# Patient Record
Sex: Male | Born: 1995 | Race: Black or African American | Hispanic: No | Smoking: Never smoker
Health system: Southern US, Community
[De-identification: ages and names within clinical notes are randomized; demographics above are authoritative.]

## PROBLEM LIST (undated history)

## (undated) HISTORY — PX: TONSILLECTOMY: SUR1361

---
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
## 2015-05-29 ENCOUNTER — Emergency Department (HOSPITAL_COMMUNITY)
Admission: EM | Admit: 2015-05-29 | Discharge: 2015-05-29 | Disposition: A | Discharge status: Home / Self Care | Payer: Medicaid Other | Attending: Emergency Medicine | Admitting: Emergency Medicine

## 2015-05-29 ENCOUNTER — Encounter (HOSPITAL_COMMUNITY): Payer: Self-pay | Admitting: *Deleted

## 2015-05-29 DIAGNOSIS — M25512 Pain in left shoulder: Secondary | ICD-10-CM | POA: Insufficient documentation

## 2015-05-29 DIAGNOSIS — Z720 Tobacco use: Secondary | ICD-10-CM | POA: Insufficient documentation

## 2015-05-29 DIAGNOSIS — Z86718 Personal history of other venous thrombosis and embolism: Secondary | ICD-10-CM | POA: Insufficient documentation

## 2015-05-29 DIAGNOSIS — Z86711 Personal history of pulmonary embolism: Secondary | ICD-10-CM | POA: Insufficient documentation

## 2015-05-29 MED ORDER — IBUPROFEN 400 MG PO TABS
600.0000 mg | ORAL_TABLET | Freq: Once | ORAL | Status: AC
  Administered 2015-05-29: 600 mg via ORAL
  Filled 2015-05-29 (×2): qty 1

## 2015-05-29 NOTE — Discharge Instructions (Signed)
Cryotherapy °Cryotherapy is when you put ice on your injury. Ice helps lessen pain and puffiness (swelling) after an injury. Ice works the best when you start using it in the first 24 to 48 hours after an injury. °HOME CARE °· Put a dry or damp towel between the ice pack and your skin. °· You may press gently on the ice pack. °· Leave the ice on for no more than 10 to 20 minutes at a time. °· Check your skin after 5 minutes to make sure your skin is okay. °· Rest at least 20 minutes between ice pack uses. °· Stop using ice when your skin loses feeling (numbness). °· Do not use ice on someone who cannot tell you when it hurts. This includes small children and people with memory problems (dementia). °GET HELP RIGHT AWAY IF: °· You have white spots on your skin. °· Your skin turns blue or pale. °· Your skin feels waxy or hard. °· Your puffiness gets worse. °MAKE SURE YOU:  °· Understand these instructions. °· Will watch your condition. °· Will get help right away if you are not doing well or get worse. °  °This information is not intended to replace advice given to you by your health care provider. Make sure you discuss any questions you have with your health care provider. °  °Document Released: 01/17/2008 Document Revised: 10/23/2011 Document Reviewed: 03/23/2011 °Elsevier Interactive Patient Education ©2016 Elsevier Inc. ° °Joint Pain °Joint pain, which is also called arthralgia, can be caused by many things. Joint pain often goes away when you follow your health care provider's instructions for relieving pain at home. However, joint pain can also be caused by conditions that require further treatment. Common causes of joint pain include: °· Bruising in the area of the joint. °· Overuse of the joint. °· Wear and tear on the joints that occur with aging (osteoarthritis). °· Various other forms of arthritis. °· A buildup of a crystal form of uric acid in the joint (gout). °· Infections of the joint (septic arthritis)  or of the bone (osteomyelitis). °Your health care provider may recommend medicine to help with the pain. If your joint pain continues, additional tests may be needed to diagnose your condition. °HOME CARE INSTRUCTIONS °Watch your condition for any changes. Follow these instructions as directed to lessen the pain that you are feeling. °· Take medicines only as directed by your health care provider. °· Rest the affected area for as long as your health care provider says that you should. If directed to do so, raise the painful joint above the level of your heart while you are sitting or lying down. °· Do not do things that cause or worsen pain. °· If directed, apply ice to the painful area: °¨ Put ice in a plastic bag. °¨ Place a towel between your skin and the bag. °¨ Leave the ice on for 20 minutes, 2-3 times per day. °· Wear an elastic bandage, splint, or sling as directed by your health care provider. Loosen the elastic bandage or splint if your fingers or toes become numb and tingle, or if they turn cold and blue. °· Begin exercising or stretching the affected area as directed by your health care provider. Ask your health care provider what types of exercise are safe for you. °· Keep all follow-up visits as directed by your health care provider. This is important. °SEEK MEDICAL CARE IF: °· Your pain increases, and medicine does not help. °· Your joint   pain does not improve within 3 days.  You have increased bruising or swelling.  You have a fever.  You lose 10 lb (4.5 kg) or more without trying. SEEK IMMEDIATE MEDICAL CARE IF:  You are not able to move the joint.  Your fingers or toes become numb or they turn cold and blue.   This information is not intended to replace advice given to you by your health care provider. Make sure you discuss any questions you have with your health care provider.   Document Released: 07/31/2005 Document Revised: 08/21/2014 Document Reviewed: 05/12/2014 Elsevier  Interactive Patient Education 2016 [employer]  Please read attached information. If you experience any new or worsening signs or symptoms please return to the emergency room for evaluation. Please follow-up with your primary care provider or specialist as discussed. Please use medication prescribed only as directed and discontinue taking if you have any concerning signs or symptoms.

## 2015-05-29 NOTE — ED Notes (Signed)
Pt. States left shoulder just started hurting last night at 11 pm and hasnt stopped since. Denies injury or trauma.

## 2015-05-29 NOTE — ED Provider Notes (Signed)
CSN: [id number]     Arrival date & time 05/29/15  0618 History   First MD Initiated Contact with Patient 05/29/15 [phone]     Chief Complaint  Patient presents with  . Shoulder Pain   HPI   19 year old male presents today with left shoulder pain. Patient reports approximately 11 PM last night he started developing left lateral and anterior shoulder pain. He describes it as throbbing, denies any radiation of symptoms. He reports the pain is worse with movement of the extremity. Patient denies fever, chills, weight loss, nausea, vomiting, chest pain, shortness of breath, cough, abdominal pain, lower extremity swelling or edema, history DVT PE, or any other concerning signs or symptoms today. Patient reports that he's tried icy hot, no other over-the-counter therapies at this time. Patient denies any trauma to the shoulder, or history of the same. Patient denies any loss of distal sensation strength or function of the extremity, reports full active range of motion.   History reviewed. No pertinent past medical history. Past Surgical History  Procedure Laterality Date  . Tonsillectomy     History reviewed. No pertinent family history. Social History  Substance Use Topics  . Smoking status: Current Every Day Smoker -- 1.00 packs/day    Types: Cigarettes  . Smokeless tobacco: Never Used  . Alcohol Use: No    Review of Systems  All other systems reviewed and are negative.    Allergies  Review of patient's allergies indicates no known allergies.  Home Medications   Prior to Admission medications   Not on File   BP 122/80 mmHg  Pulse 93  Temp(Src) 98.8 F (37.1 C) (Oral)  Resp 18  SpO2 97% Physical Exam  Constitutional: He is oriented to person, place, and time. He appears well-developed and well-nourished.  HENT:  Head: Normocephalic and atraumatic.  Eyes: Conjunctivae are normal. Pupils are equal, round, and reactive to light. Right eye exhibits no discharge. Left eye exhibits  no discharge. No scleral icterus.  Neck: Normal range of motion. No JVD present. No tracheal deviation present.  Pulmonary/Chest: Effort normal. No stridor.  Musculoskeletal:  Tenderness to palpation of left lateral shoulder, no signs of deformity, rash, swelling. Pain with active flexion, internal/external rotation. Full active range of motion. Distal sensation, strength, motor function intact, cap refill less than 3 seconds, radial pulses 2+ and equal bilateral  Neurological: He is alert and oriented to person, place, and time. Coordination normal.  Psychiatric: He has a normal mood and affect. His behavior is normal. Judgment and thought content normal.  Nursing note and vitals reviewed.   ED Course  Procedures (including critical care time) Labs Review Labs Reviewed - No data to display  Imaging Review No results found. I have personally reviewed and evaluated these images and lab results as part of my medical decision-making.   EKG Interpretation None      MDM   Final diagnoses:  Left shoulder pain    Labs:  Imaging:  Consults:  Therapeutics:  Discharge Meds:   Assessment/Plan: Patient presents with left shoulder pain, most consistent with musculoskeletal pain. He has reproducible pain with range of motion exercises. This is unlikely to be referred pain due to GI, pulmonary, cardiac, or vascular. Patient will be instructed to use ibuprofen or Tylenol as needed for pain, follow up with primary care for reevaluation 1 week if symptoms persist. He states instructed return immediately if new or worsening signs or symptoms present.         Tinnie Gens  Arriyana Rodell, PA-C 05/31/15 1447  Tomasita CrumbleAdeleke Oni, MD 06/02/15 [phone]

## 2015-09-22 ENCOUNTER — Encounter (HOSPITAL_COMMUNITY): Payer: Self-pay | Admitting: Family Medicine

## 2015-09-22 ENCOUNTER — Emergency Department (HOSPITAL_COMMUNITY)
Admission: EM | Admit: 2015-09-22 | Discharge: 2015-09-22 | Disposition: A | Discharge status: Home / Self Care | Payer: Medicaid Other | Attending: Emergency Medicine | Admitting: Emergency Medicine

## 2015-09-22 DIAGNOSIS — Z711 Person with feared health complaint in whom no diagnosis is made: Secondary | ICD-10-CM

## 2015-09-22 DIAGNOSIS — F17210 Nicotine dependence, cigarettes, uncomplicated: Secondary | ICD-10-CM | POA: Insufficient documentation

## 2015-09-22 DIAGNOSIS — Z113 Encounter for screening for infections with a predominantly sexual mode of transmission: Secondary | ICD-10-CM | POA: Insufficient documentation

## 2015-09-22 NOTE — Discharge Instructions (Signed)
Sexually Transmitted Disease °A sexually transmitted disease (STD) is a disease or infection that may be passed (transmitted) from person to person, usually during sexual activity. This may happen by way of saliva, semen, blood, vaginal mucus, or urine. Common STDs include: °· Gonorrhea. °· Chlamydia. °· Syphilis. °· HIV and AIDS. °· Genital herpes. °· Hepatitis B and C. °· Trichomonas. °· Human papillomavirus (HPV). °· Pubic lice. °· Scabies. °· Mites. °· Bacterial vaginosis. °WHAT ARE CAUSES OF STDs? °An STD may be caused by bacteria, a virus, or parasites. STDs are often transmitted during sexual activity if one person is infected. However, they may also be transmitted through nonsexual means. STDs may be transmitted after:  °· Sexual intercourse with an infected person. °· Sharing sex toys with an infected person. °· Sharing needles with an infected person or using unclean piercing or tattoo needles. °· Having intimate contact with the genitals, mouth, or rectal areas of an infected person. °· Exposure to infected fluids during birth. °WHAT ARE THE SIGNS AND SYMPTOMS OF STDs? °Different STDs have different symptoms. Some people may not have any symptoms. If symptoms are present, they may include: °· Painful or bloody urination. °· Pain in the pelvis, abdomen, vagina, anus, throat, or eyes. °· A skin rash, itching, or irritation. °· Growths, ulcerations, blisters, or sores in the genital and anal areas. °· Abnormal vaginal discharge with or without bad odor. °· Penile discharge in men. °· Fever. °· Pain or bleeding during sexual intercourse. °· Swollen glands in the groin area. °· Yellow skin and eyes (jaundice). This is seen with hepatitis. °· Swollen testicles. °· Infertility. °· Sores and blisters in the mouth. °HOW ARE STDs DIAGNOSED? °To make a diagnosis, your health care provider may: °· Take a medical history. °· Perform a physical exam. °· Take a sample of any discharge to examine. °· Swab the throat,  cervix, opening to the penis, rectum, or vagina for testing. °· Test a sample of your first morning urine. °· Perform blood tests. °· Perform a Pap test, if this applies. °· Perform a colposcopy. °· Perform a laparoscopy. °HOW ARE STDs TREATED? °Treatment depends on the STD. Some STDs may be treated but not cured. °· Chlamydia, gonorrhea, trichomonas, and syphilis can be cured with antibiotic medicine. °· Genital herpes, hepatitis, and HIV can be treated, but not cured, with prescribed medicines. The medicines lessen symptoms. °· Genital warts from HPV can be treated with medicine or by freezing, burning (electrocautery), or surgery. Warts may come back. °· HPV cannot be cured with medicine or surgery. However, abnormal areas may be removed from the cervix, vagina, or vulva. °· If your diagnosis is confirmed, your recent sexual partners need treatment. This is true even if they are symptom-free or have a negative culture or evaluation. They should not have sex until their health care providers say it is okay. °· Your health care provider may test you for infection again 3 months after treatment. °HOW CAN I REDUCE MY RISK OF GETTING AN STD? °Take these steps to reduce your risk of getting an STD: °· Use latex condoms, dental dams, and water-soluble lubricants during sexual activity. Do not use petroleum jelly or oils. °· Avoid having multiple sex partners. °· Do not have sex with someone who has other sex partners °· Do not have sex with anyone you do not know or who is at high risk for an STD. °· Avoid risky sex practices that can break your skin. °· Do not have sex   if you have open sores on your mouth or skin. °· Avoid drinking too much alcohol or taking illegal drugs. Alcohol and drugs can affect your judgment and put you in a vulnerable position. °· Avoid engaging in oral and anal sex acts. °· Get vaccinated for HPV and hepatitis. If you have not received these vaccines in the past, talk to your health care  provider about whether one or both might be right for you. °· If you are at risk of being infected with HIV, it is recommended that you take a prescription medicine daily to prevent HIV infection. This is called pre-exposure prophylaxis (PrEP). You are considered at risk if: °¨ You are a man who has sex with other men (MSM). °¨ You are a heterosexual man or woman and are sexually active with more than one partner. °¨ You take drugs by injection. °¨ You are sexually active with a partner who has HIV. °· Talk with your health care provider about whether you are at high risk of being infected with HIV. If you choose to begin PrEP, you should first be tested for HIV. You should then be tested every 3 months for as long as you are taking PrEP. °WHAT SHOULD I DO IF I THINK I HAVE AN STD? °· See your health care provider. °· Tell your sexual partner(s). They should be tested and treated for any STDs. °· Do not have sex until your health care provider says it is okay. °WHEN SHOULD I GET IMMEDIATE MEDICAL CARE? °Contact your health care provider right away if:  °· You have severe abdominal pain. °· You are a man and notice swelling or pain in your testicles. °· You are a woman and notice swelling or pain in your vagina. °  °This information is not intended to replace advice given to you by your health care provider. Make sure you discuss any questions you have with your health care provider. °  °Document Released: 10/21/2002 Document Revised: 08/21/2014 Document Reviewed: 02/18/2013 °Elsevier Interactive Patient Education ©2016 Elsevier Inc. ° °

## 2015-09-22 NOTE — ED Provider Notes (Signed)
CSN: [id number]     Arrival date & time 09/22/15  1258 History  By signing my name below, I, Soijett Blue, attest that this documentation has been prepared under the direction and in the presence of Roxy Horseman, PA-C Electronically Signed: Soijett Blue, ED Scribe. 09/22/2015. 2:14 PM.   Chief Complaint  Patient presents with  . SEXUALLY TRANSMITTED DISEASE      The history is provided by the patient. No language interpreter was used.    Ricardo Randall is a 20 y.o. male who presents to the Emergency Department complaining of bumps to his penis since childhood. He notes that his girlfriend want him to be checked out for the bumps to his penis that he would like checked out. He notes that the bumps to his penis have been there since he was a little kid and he denies pain to the area. He states that he has not tried any medications for the relief for his symptoms. He denies penile discharge, dysuria, penile pain/swelling, testicular pain/swelling, and any other symptoms. Denies allergies to any medications.    History reviewed. No pertinent past medical history. Past Surgical History  Procedure Laterality Date  . Tonsillectomy     History reviewed. No pertinent family history. Social History  Substance Use Topics  . Smoking status: Current Every Day Smoker -- 1.00 packs/day    Types: Cigarettes  . Smokeless tobacco: Never Used  . Alcohol Use: No    Review of Systems  Constitutional: Negative for fever.  Genitourinary: Negative for dysuria, discharge, penile swelling, scrotal swelling, penile pain and testicular pain.       Bumps to penis      Allergies  Review of patient's allergies indicates no known allergies.  Home Medications   Prior to Admission medications   Not on File   BP 131/84 mmHg  Pulse 70  Temp(Src) 98 F (36.7 C) (Oral)  Resp 20  Wt 285 lb 1 oz (129.304 kg)  SpO2 98% Physical Exam  Constitutional: He is oriented to person, place, and time. He appears  well-developed and well-nourished. No distress.  HENT:  Head: Normocephalic and atraumatic.  Eyes: EOM are normal.  Neck: Neck supple.  Cardiovascular: Normal rate.   Pulmonary/Chest: Effort normal. No respiratory distress.  Genitourinary: Uncircumcised. No penile tenderness. No discharge found.  Scribe chaperone present for exam. No TTP. No lesions. No discharge. 3 small skin tags doesn't appear to be consisted with genital warts or herpes.   Musculoskeletal: Normal range of motion.  Neurological: He is alert and oriented to person, place, and time.  Skin: Skin is warm and dry.  Psychiatric: He has a normal mood and affect. His behavior is normal.  Nursing note and vitals reviewed.    ED Course  Procedures (including critical care time) DIAGNOSTIC STUDIES: Oxygen Saturation is 98% on RA, nl by my interpretation.    COORDINATION OF CARE: 2:13 PM Discussed treatment plan with pt at bedside which includes GC/chlamydia probe and pt agreed to plan.     MDM   Final diagnoses:  Concern about STD in male without diagnosis    Patient with skin tags on tip of penis. States that they have been there since he was a kid.  Does not look like herpes or genital warts.  Request STD check.  I personally performed the services described in this documentation, which was scribed in my presence. The recorded information has been reviewed and is accurate.     Roxy Horseman, PA-C  09/22/15 1430  Tilden Fossa, MD 09/23/15 [phone]

## 2015-09-22 NOTE — ED Notes (Signed)
Pt here for bumps to penis and wants to be checked for STD.

## 2015-09-23 LAB — GC/CHLAMYDIA PROBE AMP (~~LOC~~) NOT AT ARMC
CHLAMYDIA, DNA PROBE: NEGATIVE
NEISSERIA GONORRHEA: NEGATIVE

## 2016-04-20 ENCOUNTER — Emergency Department (HOSPITAL_COMMUNITY)
Admission: EM | Admit: 2016-04-20 | Discharge: 2016-04-21 | Disposition: A | Discharge status: Home / Self Care | Payer: Medicaid Other | Attending: Emergency Medicine | Admitting: Emergency Medicine

## 2016-04-20 ENCOUNTER — Encounter (HOSPITAL_COMMUNITY): Payer: Self-pay | Admitting: Emergency Medicine

## 2016-04-20 DIAGNOSIS — F17210 Nicotine dependence, cigarettes, uncomplicated: Secondary | ICD-10-CM | POA: Insufficient documentation

## 2016-04-20 DIAGNOSIS — K047 Periapical abscess without sinus: Secondary | ICD-10-CM | POA: Insufficient documentation

## 2016-04-20 NOTE — ED Triage Notes (Signed)
Pt here with right upper dental pain and swelling. Pt denies fever.

## 2016-04-21 MED ORDER — CLINDAMYCIN HCL 150 MG PO CAPS
450.0000 mg | ORAL_CAPSULE | Freq: Once | ORAL | Status: AC
  Administered 2016-04-21: 450 mg via ORAL
  Filled 2016-04-21: qty 3

## 2016-04-21 MED ORDER — BUPIVACAINE-EPINEPHRINE (PF) 0.5% -1:200000 IJ SOLN
1.8000 mL | Freq: Once | INTRAMUSCULAR | Status: AC
  Administered 2016-04-21: 1.8 mL
  Filled 2016-04-21: qty 1.8

## 2016-04-21 MED ORDER — CLINDAMYCIN HCL 150 MG PO CAPS: 450.0000 mg | ORAL_CAPSULE | Freq: Three times a day (TID) | ORAL | 0 refills | Status: DC

## 2016-04-21 MED ORDER — IBUPROFEN 800 MG PO TABS
800.0000 mg | ORAL_TABLET | Freq: Once | ORAL | Status: AC
  Administered 2016-04-21: 800 mg via ORAL
  Filled 2016-04-21: qty 1

## 2016-04-21 MED ORDER — HYDROCODONE-ACETAMINOPHEN 5-325 MG PO TABS: 1.0000 | ORAL_TABLET | Freq: Three times a day (TID) | ORAL | 0 refills | Status: DC | PRN

## 2016-04-21 MED ORDER — BUPIVACAINE HCL 0.25 % IJ SOLN
5.0000 mL | Freq: Once | INTRAMUSCULAR | Status: DC
  Filled 2016-04-21: qty 5

## 2016-04-21 NOTE — ED Provider Notes (Signed)
[facility] DEPT Provider Note   CSN: [id number] Arrival date & time: 04/20/16  2223     History   Chief Complaint Chief Complaint  Patient presents with  . Dental Pain    HPI Orie RoutDarian R Randall is a 20 y.o. male with no major medical problems presents to the Emergency Department complaining of gradual, persistent, progressively worsening dental pain and gingival swelling onset 48 hours prior. Associated symptoms include pain at the site. Pain is worse with eating and drinking. Treatments prior to arrival. Patient denies fever, chills, nausea, vomiting. Unknown last visit to the dentist.    The history is provided by the patient and medical records. No language interpreter was used.    History reviewed. No pertinent past medical history.  There are no active problems to display for this patient.   Past Surgical History:  Procedure Laterality Date  . TONSILLECTOMY         Home Medications    Prior to Admission medications   Medication Sig Start Date End Date Taking? Authorizing Provider  clindamycin (CLEOCIN) 150 MG capsule Take 3 capsules (450 mg total) by mouth 3 (three) times daily. 04/21/16   Jazae Gandolfi, PA-C  HYDROcodone-acetaminophen (NORCO/VICODIN) 5-325 MG tablet Take 1 tablet by mouth every 8 (eight) hours as needed for severe pain. 04/21/16   Dahlia ClientHannah Kairos Panetta, PA-C    Family History History reviewed. No pertinent family history.  Social History Social History  Substance Use Topics  . Smoking status: Current Every Day Smoker    Packs/day: 1.00    Types: Cigarettes  . Smokeless tobacco: Never Used  . Alcohol use No     Allergies   Review of patient's allergies indicates no known allergies.   Review of Systems Review of Systems  Constitutional: Negative for chills, fatigue and fever.  HENT: Positive for dental problem.   Gastrointestinal: Negative for abdominal pain, nausea and vomiting.  All other systems reviewed and are  negative.    Physical Exam Updated Vital Signs BP 142/85   Pulse 81   Temp 98.5 F (36.9 C) (Oral)   Resp 18   Ht 5\' 9"  (1.753 m)   Wt 129.3 kg   SpO2 99%   BMI 42.09 kg/m   Physical Exam  Constitutional: He appears well-developed and well-nourished.  HENT:  Head: Normocephalic.  Right Ear: Tympanic membrane, external ear and ear canal normal.  Left Ear: Tympanic membrane, external ear and ear canal normal.  Nose: Nose normal. Right sinus exhibits no maxillary sinus tenderness and no frontal sinus tenderness. Left sinus exhibits no maxillary sinus tenderness and no frontal sinus tenderness.  Mouth/Throat: Uvula is midline, oropharynx is clear and moist and mucous membranes are normal. No oral lesions. Abnormal dentition. Dental abscesses and dental caries present. No uvula swelling or lacerations. No oropharyngeal exudate, posterior oropharyngeal edema, posterior oropharyngeal erythema or tonsillar abscesses.  Tooth # 2-3 with gingival erythema and abscess to the lateral gingiva No fluctuance or induration to the buccal mucosa or floor of the mouth  Eyes: Conjunctivae are normal. Pupils are equal, round, and reactive to light. Right eye exhibits no discharge. Left eye exhibits no discharge.  Neck: Normal range of motion. Neck supple.  No stridor Handling secretions without difficulty No nuchal rigidity No cervical lymphadenopathy   Cardiovascular: Normal rate, regular rhythm and normal heart sounds.   Pulmonary/Chest: Effort normal. No respiratory distress.  Equal chest rise  Abdominal: Soft. Bowel sounds are normal. He exhibits no distension. There is no tenderness.  Lymphadenopathy:    He has no cervical adenopathy.  Neurological: He is alert.  Skin: Skin is warm and dry.  Psychiatric: He has a normal mood and affect.  Nursing note and vitals reviewed.    ED Treatments / Results  Labs (all labs ordered are listed, but only abnormal results are displayed) Labs  Reviewed - No data to display  EKG  EKG Interpretation None       Radiology No results found.  Procedures .Marland KitchenIncision and Drainage Date/Time: 04/21/2016 12:39 AM Performed by: Dierdre Forth Authorized by: Dierdre Forth   Consent:    Consent obtained:  Verbal   Consent given by:  Patient   Risks discussed:  Bleeding, incomplete drainage, infection and pain   Alternatives discussed:  No treatment and alternative treatment (antibiotics alone) Location:    Type:  Abscess   Location:  Mouth   Mouth location: gingival. Anesthesia (see MAR for exact dosages):    Anesthesia method:  Local infiltration   Local anesthetic:  Bupivacaine 0.5% WITH epi Procedure type:    Complexity:  Simple Procedure details:    Needle aspiration: no     Incision types:  Single straight   Incision depth:  Dermal   Scalpel blade:  11   Wound management:  Probed and deloculated   Drainage:  Bloody and purulent   Drainage amount:  Moderate   Wound treatment:  Wound left open   Packing materials:  None Post-procedure details:    Patient tolerance of procedure:  Tolerated well, no immediate complications   (including critical care time)  Medications Ordered in ED Medications  bupivacaine-epinephrine (MARCAINE W/ EPI) 0.5% -1:200000 injection 1.8 mL (not administered)  ibuprofen (ADVIL,MOTRIN) tablet 800 mg (not administered)  clindamycin (CLEOCIN) capsule 450 mg (not administered)     Initial Impression / Assessment and Plan / ED Course  I have reviewed the triage vital signs and the nursing notes.  Pertinent labs & imaging results that were available during my care of the patient were reviewed by me and considered in my medical decision making (see chart for details).  Clinical Course    Patient with toothache. Abscess noted. I and D performed without complication..  Exam unconcerning for Ludwig's angina or spread of infection.  Will treat with clindamycin and pain medicine.   Urged patient to follow-up with dentist.     Final Clinical Impressions(s) / ED Diagnoses   Final diagnoses:  Dental abscess    New Prescriptions New Prescriptions   CLINDAMYCIN (CLEOCIN) 150 MG CAPSULE    Take 3 capsules (450 mg total) by mouth 3 (three) times daily.   HYDROCODONE-ACETAMINOPHEN (NORCO/VICODIN) 5-325 MG TABLET    Take 1 tablet by mouth every 8 (eight) hours as needed for severe pain.     Dahlia Client Sahej Hauswirth, PA-C 04/21/16 1610    Rolland Porter, MD 04/30/16 [phone]

## 2016-04-21 NOTE — Discharge Instructions (Signed)
1. Medications: vicodin if regular usage of ibuprofen does not provide adequate relief, clindamycin usual home medications 2. Treatment: rest, drink plenty of fluids, take medications as prescribed 3. Follow Up: Please followup with dentistry within 1 week for discussion of your diagnoses and further evaluation after today's visit; if you do not have a primary care doctor use the resource guide provided to find one; Return to the ER for high fevers, difficulty breathing, difficulty swallowing or other concerning symptoms

## 2016-07-10 ENCOUNTER — Encounter (HOSPITAL_COMMUNITY): Payer: Self-pay | Admitting: Emergency Medicine

## 2016-07-10 ENCOUNTER — Emergency Department (HOSPITAL_COMMUNITY)
Admission: EM | Admit: 2016-07-10 | Discharge: 2016-07-10 | Disposition: A | Discharge status: Home / Self Care | Payer: Self-pay | Attending: Emergency Medicine | Admitting: Emergency Medicine

## 2016-07-10 ENCOUNTER — Emergency Department (HOSPITAL_COMMUNITY): Payer: Self-pay

## 2016-07-10 DIAGNOSIS — Y929 Unspecified place or not applicable: Secondary | ICD-10-CM | POA: Insufficient documentation

## 2016-07-10 DIAGNOSIS — S91002A Unspecified open wound, left ankle, initial encounter: Secondary | ICD-10-CM | POA: Insufficient documentation

## 2016-07-10 DIAGNOSIS — Y999 Unspecified external cause status: Secondary | ICD-10-CM | POA: Insufficient documentation

## 2016-07-10 DIAGNOSIS — F17200 Nicotine dependence, unspecified, uncomplicated: Secondary | ICD-10-CM | POA: Insufficient documentation

## 2016-07-10 DIAGNOSIS — Y939 Activity, unspecified: Secondary | ICD-10-CM | POA: Insufficient documentation

## 2016-07-10 DIAGNOSIS — W3400XA Accidental discharge from unspecified firearms or gun, initial encounter: Secondary | ICD-10-CM | POA: Insufficient documentation

## 2016-07-10 DIAGNOSIS — S81832A Puncture wound without foreign body, left lower leg, initial encounter: Secondary | ICD-10-CM

## 2016-07-10 DIAGNOSIS — Z5181 Encounter for therapeutic drug level monitoring: Secondary | ICD-10-CM | POA: Insufficient documentation

## 2016-07-10 LAB — CBC WITH DIFFERENTIAL/PLATELET
BASOS ABS: 0 10*3/uL (ref 0.0–0.1)
BASOS PCT: 0 %
EOS ABS: 0.1 10*3/uL (ref 0.0–0.7)
EOS PCT: 1 %
HCT: 43.5 % (ref 39.0–52.0)
Hemoglobin: 15.5 g/dL (ref 13.0–17.0)
LYMPHS PCT: 38 %
Lymphs Abs: 4.4 10*3/uL — ABNORMAL HIGH (ref 0.7–4.0)
MCH: 31.3 pg (ref 26.0–34.0)
MCHC: 35.6 g/dL (ref 30.0–36.0)
MCV: 87.9 fL (ref 78.0–100.0)
MONO ABS: 1 10*3/uL (ref 0.1–1.0)
Monocytes Relative: 9 %
Neutro Abs: 5.9 10*3/uL (ref 1.7–7.7)
Neutrophils Relative %: 52 %
PLATELETS: 225 10*3/uL (ref 150–400)
RBC: 4.95 MIL/uL (ref 4.22–5.81)
RDW: 12.5 % (ref 11.5–15.5)
WBC: 11.5 10*3/uL — ABNORMAL HIGH (ref 4.0–10.5)

## 2016-07-10 LAB — APTT: APTT: 27 s (ref 24–36)

## 2016-07-10 LAB — BASIC METABOLIC PANEL
Anion gap: 9 (ref 5–15)
BUN: 6 mg/dL (ref 6–20)
CALCIUM: 9.2 mg/dL (ref 8.9–10.3)
CO2: 23 mmol/L (ref 22–32)
CREATININE: 0.96 mg/dL (ref 0.61–1.24)
Chloride: 107 mmol/L (ref 101–111)
GFR calc Af Amer: >60 mL/min (ref >60)
GLUCOSE: 113 mg/dL — AB (ref 65–99)
Potassium: 3.5 mmol/L (ref 3.5–5.1)
SODIUM: 139 mmol/L (ref 135–145)

## 2016-07-10 LAB — TYPE AND SCREEN
ABO/RH(D): O POS
ANTIBODY SCREEN: NEGATIVE

## 2016-07-10 LAB — PROTIME-INR
INR: 1.02
PROTHROMBIN TIME: 13.4 s (ref 11.4–15.2)

## 2016-07-10 LAB — ABO/RH: ABO/RH(D): O POS

## 2016-07-10 MED ORDER — SODIUM CHLORIDE 0.9 % IV BOLUS (SEPSIS)
1000.0000 mL | Freq: Once | INTRAVENOUS | Status: AC
  Administered 2016-07-10: 1000 mL via INTRAVENOUS

## 2016-07-10 MED ORDER — TETANUS-DIPHTH-ACELL PERTUSSIS 5-2.5-18.5 LF-MCG/0.5 IM SUSP
0.5000 mL | Freq: Once | INTRAMUSCULAR | Status: AC
  Administered 2016-07-10: 0.5 mL via INTRAMUSCULAR
  Filled 2016-07-10: qty 0.5

## 2016-07-10 MED ORDER — HYDROCODONE-ACETAMINOPHEN 5-325 MG PO TABS
1.0000 | ORAL_TABLET | ORAL | 0 refills | Status: DC | PRN
Start: 2016-07-10 — End: 2018-07-30

## 2016-07-10 MED ORDER — CEFAZOLIN IN D5W 1 GM/50ML IV SOLN
1.0000 g | Freq: Once | INTRAVENOUS | Status: AC
  Administered 2016-07-10: 1 g via INTRAVENOUS
  Filled 2016-07-10: qty 50

## 2016-07-10 MED ORDER — CEPHALEXIN 500 MG PO CAPS: 500.0000 mg | ORAL_CAPSULE | Freq: Three times a day (TID) | ORAL | 0 refills | Status: DC

## 2016-07-10 NOTE — ED Notes (Signed)
Ortho tech called and will place post op boot.

## 2016-07-10 NOTE — ED Notes (Signed)
Patient handed urinal to use.
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
## 2016-07-10 NOTE — Progress Notes (Signed)
Orthopedic Tech Progress Note Patient Details:  Ricardo Randall 06/23/1996 [id number]  Ortho Devices Type of Ortho Device: CAM walker, Crutches Ortho Device/Splint Location: Applied Cam Walker and Crutches with Training for Left lower Leg (LLE) Ortho Device/Splint Interventions: Application, Adjustment   Alvina ChouWilliams, Marlet Korte C 07/10/2016, 7:08 PM
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
## 2016-07-10 NOTE — Discharge Instructions (Signed)
Take antibiotics as prescribed. Take Norco as needed for pain. If your pain is not that severe you may take over the counter ibuprofen instead. Keep the area clean with warm water and soap. Return to the emergency room for new or worsening symptoms.

## 2016-07-11 ENCOUNTER — Encounter (HOSPITAL_COMMUNITY): Payer: Self-pay | Admitting: Emergency Medicine

## 2016-08-04 ENCOUNTER — Emergency Department (HOSPITAL_COMMUNITY)
Admission: EM | Admit: 2016-08-04 | Discharge: 2016-08-04 | Disposition: A | Discharge status: Home / Self Care | Attending: Emergency Medicine | Admitting: Emergency Medicine

## 2016-08-04 ENCOUNTER — Encounter (HOSPITAL_COMMUNITY): Payer: Self-pay

## 2016-08-04 DIAGNOSIS — L089 Local infection of the skin and subcutaneous tissue, unspecified: Secondary | ICD-10-CM

## 2016-08-04 DIAGNOSIS — L0889 Other specified local infections of the skin and subcutaneous tissue: Secondary | ICD-10-CM | POA: Insufficient documentation

## 2016-08-04 DIAGNOSIS — T148XXA Other injury of unspecified body region, initial encounter: Secondary | ICD-10-CM

## 2016-08-04 DIAGNOSIS — F17210 Nicotine dependence, cigarettes, uncomplicated: Secondary | ICD-10-CM | POA: Insufficient documentation

## 2016-08-04 LAB — CBC WITH DIFFERENTIAL/PLATELET
BASOS PCT: 0 %
Basophils Absolute: 0 10*3/uL (ref 0.0–0.1)
EOS ABS: 0.1 10*3/uL (ref 0.0–0.7)
EOS PCT: 1 %
HCT: 42.3 % (ref 39.0–52.0)
Hemoglobin: 15.2 g/dL (ref 13.0–17.0)
LYMPHS ABS: 1.8 10*3/uL (ref 0.7–4.0)
Lymphocytes Relative: 27 %
MCH: 31.2 pg (ref 26.0–34.0)
MCHC: 35.9 g/dL (ref 30.0–36.0)
MCV: 86.9 fL (ref 78.0–100.0)
MONOS PCT: 11 %
Monocytes Absolute: 0.8 10*3/uL (ref 0.1–1.0)
Neutro Abs: 4 10*3/uL (ref 1.7–7.7)
Neutrophils Relative %: 61 %
PLATELETS: 207 10*3/uL (ref 150–400)
RBC: 4.87 MIL/uL (ref 4.22–5.81)
RDW: 12.5 % (ref 11.5–15.5)
WBC: 6.7 10*3/uL (ref 4.0–10.5)

## 2016-08-04 LAB — I-STAT CHEM 8, ED
BUN: 5 mg/dL — ABNORMAL LOW (ref 6–20)
CALCIUM ION: 1.15 mmol/L (ref 1.15–1.40)
Chloride: 103 mmol/L (ref 101–111)
Creatinine, Ser: 1.3 mg/dL — ABNORMAL HIGH (ref 0.61–1.24)
Glucose, Bld: 96 mg/dL (ref 65–99)
HEMATOCRIT: 42 % (ref 39.0–52.0)
HEMOGLOBIN: 14.3 g/dL (ref 13.0–17.0)
Potassium: 3.9 mmol/L (ref 3.5–5.1)
SODIUM: 138 mmol/L (ref 135–145)
TCO2: 23 mmol/L (ref 0–100)
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
## 2016-08-04 NOTE — Discharge Instructions (Signed)
Go to Dr. Greig RightMurphy's office for recheck today. We spoke to Dr. Dion SaucierLandau on the phone who advised us to send you there.   Normal CBC today. WBC 6.7.  Creatinine 1.3, will need close recheck
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
## 2016-08-07 LAB — AEROBIC CULTURE W GRAM STAIN (SUPERFICIAL SPECIMEN)

## 2016-08-07 LAB — AEROBIC CULTURE  (SUPERFICIAL SPECIMEN)

## 2016-08-08 ENCOUNTER — Telehealth (HOSPITAL_BASED_OUTPATIENT_CLINIC_OR_DEPARTMENT_OTHER): Payer: Self-pay | Admitting: Emergency Medicine

## 2016-08-08 NOTE — Progress Notes (Signed)
ED Antimicrobial Stewardship Positive Culture Follow Up   Ricardo Randall is an 20 y.o. male who presented to [facility] on 08/04/2016 with a chief complaint of  Chief Complaint  Patient presents with  . Wound Infection    Recent Results (from the past 720 hour(s))  Wound or Superficial Culture     Status: None   Collection Time: 08/04/16  9:08 AM  Result Value Ref Range Status   Specimen Description WOUND ANKLE  Final   Special Requests NONE  Final   Gram Stain   Final    RARE WBC PRESENT, PREDOMINANTLY PMN ABUNDANT GRAM POSITIVE COCCI IN PAIRS IN CLUSTERS ABUNDANT GRAM NEGATIVE COCCOBACILLI    Culture   Final    MODERATE ENTEROBACTER SPECIES MODERATE PROTEUS MIRABILIS ABUNDANT EIKENELLA CORRODENS    Report Status 08/07/2016 FINAL  Final   Organism ID, Bacteria ENTEROBACTER SPECIES  Final   Organism ID, Bacteria PROTEUS MIRABILIS  Final      Susceptibility   Enterobacter species - MIC*    CEFAZOLIN >=64 RESISTANT Resistant     CEFEPIME <=1 SENSITIVE Sensitive     CEFTAZIDIME <=1 SENSITIVE Sensitive     CEFTRIAXONE <=1 SENSITIVE Sensitive     CIPROFLOXACIN <=0.25 SENSITIVE Sensitive     GENTAMICIN <=1 SENSITIVE Sensitive     IMIPENEM <=0.25 SENSITIVE Sensitive     TRIMETH/SULFA <=20 SENSITIVE Sensitive     PIP/TAZO <=4 SENSITIVE Sensitive     * MODERATE ENTEROBACTER SPECIES   Proteus mirabilis - MIC*    AMPICILLIN <=2 SENSITIVE Sensitive     CEFAZOLIN <=4 SENSITIVE Sensitive     CEFEPIME <=1 SENSITIVE Sensitive     CEFTAZIDIME <=1 SENSITIVE Sensitive     CEFTRIAXONE <=1 SENSITIVE Sensitive     CIPROFLOXACIN <=0.25 SENSITIVE Sensitive     GENTAMICIN <=1 SENSITIVE Sensitive     IMIPENEM 8 INTERMEDIATE Intermediate     TRIMETH/SULFA <=20 SENSITIVE Sensitive     AMPICILLIN/SULBACTAM <=2 SENSITIVE Sensitive     PIP/TAZO <=4 SENSITIVE Sensitive     * MODERATE PROTEUS MIRABILIS    Per notes he is being treated with Bactrim which covers these currently isolated  organisms.  [place] and will ask the flow manager to send this result to them for their information as well.  Sallee Provencalurner, Lanise Mergen S 08/08/2016, 9:34 AM Infectious Diseases Pharmacist Phone# [phone]

## 2016-08-08 NOTE — Telephone Encounter (Signed)
Post ED Visit - Positive Culture Follow-up  Culture report reviewed by antimicrobial stewardship pharmacist:  []  Enzo BiNathan Batchelder, Pharm.D. []  Celedonio MiyamotoJeremy Frens, Pharm.D., BCPS []  Garvin FilaMike Maccia, Pharm.D. []  Georgina PillionElizabeth Martin, Pharm.D., BCPS []  Stacey StreetMinh Pham, [address]Ph[address]D., BCPS, AAHIVP []  Estella HuskMichelle Turner, Pharm.D., BCPS, AAHIVP []  Tennis Mustassie Stewart, Pharm.D. []  Sherle Poeob Vincent, [address]arm[address]  Positive wound culture Treated with none, culture report faxed to Medical fax number @ [facility] @ [phone]  Berle MullMiller, Andelyn Spade 08/08/2016, 12:51 PM

## 2018-01-26 ENCOUNTER — Encounter (HOSPITAL_COMMUNITY): Payer: Self-pay | Admitting: *Deleted

## 2018-01-26 ENCOUNTER — Emergency Department (HOSPITAL_COMMUNITY)
Admission: EM | Admit: 2018-01-26 | Discharge: 2018-01-26 | Disposition: A | Discharge status: Home / Self Care | Attending: Emergency Medicine | Admitting: Emergency Medicine

## 2018-01-26 ENCOUNTER — Other Ambulatory Visit: Payer: Self-pay

## 2018-01-26 DIAGNOSIS — F17210 Nicotine dependence, cigarettes, uncomplicated: Secondary | ICD-10-CM | POA: Insufficient documentation

## 2018-01-26 DIAGNOSIS — K047 Periapical abscess without sinus: Secondary | ICD-10-CM | POA: Insufficient documentation

## 2018-01-26 DIAGNOSIS — Z79899 Other long term (current) drug therapy: Secondary | ICD-10-CM | POA: Insufficient documentation

## 2018-01-26 MED ORDER — PENICILLIN V POTASSIUM 250 MG PO TABS
500.0000 mg | ORAL_TABLET | Freq: Once | ORAL | Status: AC
  Administered 2018-01-26: 500 mg via ORAL
  Filled 2018-01-26: qty 2

## 2018-01-26 MED ORDER — IBUPROFEN 800 MG PO TABS
800.0000 mg | ORAL_TABLET | Freq: Once | ORAL | Status: AC
Start: 2018-01-26 — End: 2018-01-26
  Administered 2018-01-26: 800 mg via ORAL
  Filled 2018-01-26: qty 1

## 2018-01-26 MED ORDER — PENICILLIN V POTASSIUM 500 MG PO TABS: 500.0000 mg | ORAL_TABLET | Freq: Four times a day (QID) | ORAL | 0 refills | Status: AC

## 2018-01-26 MED ORDER — LIDOCAINE-EPINEPHRINE (PF) 2 %-1:200000 IJ SOLN
10.0000 mL | Freq: Once | INTRAMUSCULAR | Status: AC
  Administered 2018-01-26: 10 mL
  Filled 2018-01-26: qty 20

## 2018-01-26 MED ORDER — IBUPROFEN 600 MG PO TABS: 600.0000 mg | ORAL_TABLET | Freq: Four times a day (QID) | ORAL | 0 refills | Status: DC | PRN

## 2018-01-26 NOTE — ED Triage Notes (Signed)
Pt noticed abscess to L lower gum line since this morning. Hx of the same

## 2018-01-26 NOTE — ED Provider Notes (Signed)
[facility] EMERGENCY DEPARTMENT Provider Note   CSN: [id number] Arrival date & time: 01/26/18  0000     History   Chief Complaint Chief Complaint  Patient presents with  . Abscess    HPI Ricardo Randall is a 22 y.o. male.  HPI Ricardo Randall is a 22 y.o. male presents emergency department complaining of dental abscess.  Patient states he developed pain to the left lower tooth yesterday.  He states this morning woke up with, swelling.  History of dental swelling and abscesses in the past that had to be drained.  He is requesting his abscess to be drained.  Denies any fever or chills.  No facial swelling.  No difficulty breathing or swallowing.  Did not take any medications prior to coming in.  Does not have a dentist.  History reviewed. No pertinent past medical history.  There are no active problems to display for this patient.   Past Surgical History:  Procedure Laterality Date  . TONSILLECTOMY          Home Medications    Prior to Admission medications   Medication Sig Start Date End Date Taking? Authorizing Provider  cephALEXin (KEFLEX) 500 MG capsule Take 1 capsule (500 mg total) by mouth 3 (three) times daily. 07/10/16   Sam, Ace Gins, PA-C  clindamycin (CLEOCIN) 150 MG capsule Take 3 capsules (450 mg total) by mouth 3 (three) times daily. 04/21/16   Muthersbaugh, Dahlia Client, PA-C  HYDROcodone-acetaminophen (NORCO/VICODIN) 5-325 MG tablet Take 1 tablet by mouth every 8 (eight) hours as needed for severe pain. Patient not taking: Reported on 08/04/2016 04/21/16   Muthersbaugh, Dahlia Client, PA-C  HYDROcodone-acetaminophen (NORCO/VICODIN) 5-325 MG tablet Take 1 tablet by mouth every 4 (four) hours as needed. 07/10/16   Sam, Ace Gins, PA-C  ibuprofen (ADVIL,MOTRIN) 200 MG tablet Take 200 mg by mouth every 6 (six) hours as needed for moderate pain.    [provider]    Family History No family history on file.  Social History Social History   Tobacco  Use  . Smoking status: Current Every Day Smoker    Packs/day: 1.00    Types: Cigarettes  . Smokeless tobacco: Current User  Substance Use Topics  . Alcohol use: Yes  . Drug use: No     Allergies   Patient has no known allergies.   Review of Systems Review of Systems  Constitutional: Negative for chills and fever.  HENT: Positive for dental problem. Negative for facial swelling.   Respiratory: Negative for cough, chest tightness and shortness of breath.   Cardiovascular: Negative for chest pain, palpitations and leg swelling.  Musculoskeletal: Negative for arthralgias, myalgias, neck pain and neck stiffness.  Skin: Negative for rash.  Allergic/Immunologic: Negative for immunocompromised state.  Neurological: Negative for dizziness, light-headedness and headaches.  All other systems reviewed and are negative.    Physical Exam Updated Vital Signs BP 125/87 (BP Location: Right Arm)   Pulse 81   Temp 97.6 F (36.4 C) (Oral)   Resp 20   SpO2 100%   Physical Exam  Constitutional: He appears well-developed and well-nourished. No distress.  HENT:  Welling around left lower 1st molar with tooth carries. No facial swelling. No swelling  Under the tongue.   Eyes: Conjunctivae are normal.  Neck: Neck supple.  Cardiovascular: Normal rate.  Pulmonary/Chest: No respiratory distress.  Abdominal: He exhibits no distension.  Skin: Skin is warm and dry.  Nursing note and vitals reviewed.    ED  Treatments / Results  Labs (all labs ordered are listed, but only abnormal results are displayed) Labs Reviewed - No data to display  EKG None  Radiology No results found.  Procedures .Marland Kitchen.Incision and Drainage Date/Time: 01/26/2018 7:23 AM Performed by: Jaynie CrumbleKirichenko, Arkie Tagliaferro, PA-C Authorized by: Jaynie CrumbleKirichenko, Reema Chick, PA-C   Consent:    Consent obtained:  Verbal   Consent given by:  Patient   Risks discussed:  Bleeding and incomplete drainage Location:    Type:  Abscess    Location:  Mouth   Mouth location:  Alveolar process Anesthesia (see MAR for exact dosages):    Anesthesia method:  Local infiltration   Local anesthetic:  Lidocaine 2% WITH epi Procedure type:    Complexity:  Simple Procedure details:    Incision types:  Stab incision   Scalpel blade:  11   Drainage:  Bloody   Wound treatment:  Wound left open Post-procedure details:    Patient tolerance of procedure:  Tolerated well, no immediate complications   (including critical care time)  Medications Ordered in ED Medications  lidocaine-EPINEPHrine (XYLOCAINE W/EPI) 2 %-1:200000 (PF) injection 10 mL (10 mLs Other Given 01/26/18 0654)     Initial Impression / Assessment and Plan / ED Course  I have reviewed the triage vital signs and the nursing notes.  Pertinent labs & imaging results that were available during my care of the patient were reviewed by me and considered in my medical decision making (see chart for details).     Patient in emergency department with dental abscess.  I advised him the treatment is antibiotics and follow-up with a dentist.  He is requesting a drain.  Will I&D his abscess.  Placed on penicillin.  7:24 AM Incision and drainage did not reveal any purulent drainage.  Discussed starting on antibiotics, follow-up with a dentist.  Vitals:   01/26/18 0016 01/26/18 0305 01/26/18 0553  BP: (!) 136/99 (!) 137/107 125/87  Pulse: 95 88 81  Resp: 18 16 20   Temp: 98.1 F (36.7 C) 97.6 F (36.4 C)   TempSrc: Oral Oral   SpO2: 99% 100% 100%     Final Clinical Impressions(s) / ED Diagnoses   Final diagnoses:  Dental abscess    ED Discharge Orders    None       Jaynie CrumbleKirichenko, Wasil Wolke, PA-C 01/26/18 0726    Dione BoozeGlick, David, MD 01/26/18 [phone]

## 2018-01-26 NOTE — Discharge Instructions (Addendum)
Take penicillin as prescribed until all gone.  Antiseptic mouthwashes several times a day.  Follow-up with a dentist

## 2018-01-26 NOTE — ED Notes (Signed)
Patient left after triage

## 2018-01-26 NOTE — ED Notes (Signed)
Patient came back in
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
## 2018-07-30 ENCOUNTER — Ambulatory Visit (HOSPITAL_COMMUNITY)
Admission: EM | Admit: 2018-07-30 | Discharge: 2018-07-30 | Disposition: A | Discharge status: Home / Self Care | Payer: Self-pay | Attending: Family Medicine | Admitting: Family Medicine

## 2018-07-30 ENCOUNTER — Encounter (HOSPITAL_COMMUNITY): Payer: Self-pay | Admitting: *Deleted

## 2018-07-30 ENCOUNTER — Other Ambulatory Visit: Payer: Self-pay

## 2018-07-30 DIAGNOSIS — J069 Acute upper respiratory infection, unspecified: Secondary | ICD-10-CM

## 2018-07-30 DIAGNOSIS — R05 Cough: Secondary | ICD-10-CM | POA: Insufficient documentation

## 2018-07-30 DIAGNOSIS — R0981 Nasal congestion: Secondary | ICD-10-CM | POA: Insufficient documentation

## 2018-07-30 DIAGNOSIS — R52 Pain, unspecified: Secondary | ICD-10-CM | POA: Insufficient documentation

## 2018-07-30 DIAGNOSIS — F17210 Nicotine dependence, cigarettes, uncomplicated: Secondary | ICD-10-CM | POA: Insufficient documentation

## 2018-07-30 LAB — POCT RAPID STREP A: Streptococcus, Group A Screen (Direct): NEGATIVE

## 2018-07-30 NOTE — ED Triage Notes (Signed)
C/o generalized bodyaches onset last pm

## 2018-07-30 NOTE — Discharge Instructions (Signed)
Your rapid strep test was negative This is most likely a viral infection You can do over-the-counter Tylenol or ibuprofen for body aches Mucinex for cough and congestion Follow up as needed for continued or worsening symptoms

## 2018-07-30 NOTE — ED Provider Notes (Signed)
[facility]    CSN: [id number] Arrival date & time: 07/30/18  1156     History   Chief Complaint Chief Complaint  Patient presents with  . Generalized Body Aches    HPI Ricardo Randall is a 22 y.o. male.   Patient is a 22 year old male presents with 1 day of cough, congestion, body aches.  Symptoms have been constant and remain the same.  He has not take anything for his symptoms.  He denies any associated fever, chills, nausea, vomiting, diarrhea.  He denies any recent sick contacts.  ROS per HPI      History reviewed. No pertinent past medical history.  There are no active problems to display for this patient.   Past Surgical History:  Procedure Laterality Date  . TONSILLECTOMY         Home Medications    Prior to Admission medications   Not on File    Family History No family history on file.  Social History Social History   Tobacco Use  . Smoking status: Current Every Day Smoker    Packs/day: 1.00    Types: Cigarettes  . Smokeless tobacco: Current User  Substance Use Topics  . Alcohol use: Yes  . Drug use: No     Allergies   Patient has no known allergies.   Review of Systems Review of Systems   Physical Exam Triage Vital Signs ED Triage Vitals  Enc Vitals Group     BP 07/30/18 1207 133/76     Pulse Rate 07/30/18 1207 86     Resp 07/30/18 1207 18     Temp 07/30/18 1207 98.5 F (36.9 C)     Temp Source 07/30/18 1207 Oral     SpO2 07/30/18 1207 98 %     Weight --      Height --      Head Circumference --      Peak Flow --      Pain Score 07/30/18 1209 6     Pain Loc --      Pain Edu? --      Excl. in GC? --    No data found.  Updated Vital Signs BP 133/76 (BP Location: Right Arm)   Pulse 86   Temp 98.5 F (36.9 C) (Oral)   Resp 18   SpO2 98%   Visual Acuity Right Eye Distance:   Left Eye Distance:   Bilateral Distance:    Right Eye Near:   Left Eye Near:    Bilateral Near:     Physical  Exam Vitals signs and nursing note reviewed.  Constitutional:      Appearance: Normal appearance.  HENT:     Head: Normocephalic and atraumatic.     Right Ear: Tympanic membrane, ear canal and external ear normal.     Left Ear: Tympanic membrane, ear canal and external ear normal.     Nose: Congestion present.     Mouth/Throat:     Mouth: Mucous membranes are moist.     Pharynx: Posterior oropharyngeal erythema present.     Tonsils: Swelling: 1+ on the right. 1+ on the left.  Eyes:     Conjunctiva/sclera: Conjunctivae normal.  Neck:     Musculoskeletal: Normal range of motion.  Cardiovascular:     Rate and Rhythm: Normal rate and regular rhythm.     Heart sounds: Normal heart sounds.  Pulmonary:     Effort: Pulmonary effort is normal.     Breath sounds:  Normal breath sounds.  Musculoskeletal: Normal range of motion.  Lymphadenopathy:     Cervical: No cervical adenopathy.  Skin:    General: Skin is warm and dry.  Neurological:     Mental Status: He is alert.  Psychiatric:        Mood and Affect: Mood normal.      UC Treatments / Results  Labs (all labs ordered are listed, but only abnormal results are displayed) Labs Reviewed  CULTURE, GROUP A STREP [facility]  POCT RAPID STREP A    EKG None  Radiology No results found.  Procedures Procedures (including critical care time)  Medications Ordered in UC Medications - No data to display  Initial Impression / Assessment and Plan / UC Course  I have reviewed the triage vital signs and the nursing notes.  Pertinent labs & imaging results that were available during my care of the patient were reviewed by me and considered in my medical decision making (see chart for details).     Rapid strep negative Most likely viral infection Symptomatic treatment Follow up as needed for continued or worsening symptoms  Final Clinical Impressions(s) / UC Diagnoses   Final diagnoses:  Viral upper respiratory tract infection      Discharge Instructions     Your rapid strep test was negative This is most likely a viral infection You can do over-the-counter Tylenol or ibuprofen for body aches Mucinex for cough and congestion Follow up as needed for continued or worsening symptoms     ED Prescriptions    None     Controlled Substance Prescriptions [place] Controlled Substance Registry consulted? Not Applicable   Janace ArisBast, Wilhelmena Zea A, NP 07/30/18 1259

## 2018-08-02 LAB — CULTURE, GROUP A STREP (THRC)

## 2019-02-27 ENCOUNTER — Other Ambulatory Visit: Payer: Self-pay

## 2019-02-27 ENCOUNTER — Encounter (HOSPITAL_COMMUNITY): Payer: Self-pay | Admitting: Emergency Medicine

## 2019-02-27 ENCOUNTER — Emergency Department (HOSPITAL_COMMUNITY)
Admission: EM | Admit: 2019-02-27 | Discharge: 2019-02-27 | Disposition: A | Discharge status: Home / Self Care | Payer: Self-pay | Attending: Emergency Medicine | Admitting: Emergency Medicine

## 2019-02-27 ENCOUNTER — Emergency Department (HOSPITAL_COMMUNITY): Payer: Self-pay

## 2019-02-27 DIAGNOSIS — R059 Cough, unspecified: Secondary | ICD-10-CM

## 2019-02-27 DIAGNOSIS — F17210 Nicotine dependence, cigarettes, uncomplicated: Secondary | ICD-10-CM | POA: Insufficient documentation

## 2019-02-27 DIAGNOSIS — R079 Chest pain, unspecified: Secondary | ICD-10-CM

## 2019-02-27 DIAGNOSIS — R05 Cough: Secondary | ICD-10-CM | POA: Insufficient documentation

## 2019-02-27 LAB — BASIC METABOLIC PANEL
Anion gap: 11 (ref 5–15)
BUN: 11 mg/dL (ref 6–20)
CO2: 23 mmol/L (ref 22–32)
Calcium: 9.4 mg/dL (ref 8.9–10.3)
Chloride: 103 mmol/L (ref 98–111)
Creatinine, Ser: 1.16 mg/dL (ref 0.61–1.24)
GFR calc Af Amer: >60 mL/min (ref >60)
GFR calc non Af Amer: >60 mL/min (ref >60)
Glucose, Bld: 97 mg/dL (ref 70–99)
Potassium: 4.1 mmol/L (ref 3.5–5.1)
Sodium: 137 mmol/L (ref 135–145)

## 2019-02-27 LAB — D-DIMER, QUANTITATIVE: D-Dimer, Quant: <0.27 ug/mL-FEU (ref 0.00–0.50)

## 2019-02-27 LAB — CBC
HCT: 46.8 % (ref 39.0–52.0)
Hemoglobin: 16 g/dL (ref 13.0–17.0)
MCH: 31.9 pg (ref 26.0–34.0)
MCHC: 34.2 g/dL (ref 30.0–36.0)
MCV: 93.2 fL (ref 80.0–100.0)
Platelets: 203 10*3/uL (ref 150–400)
RBC: 5.02 MIL/uL (ref 4.22–5.81)
RDW: 12.8 % (ref 11.5–15.5)
WBC: 8.1 10*3/uL (ref 4.0–10.5)
nRBC: 0 % (ref 0.0–0.2)

## 2019-02-27 LAB — TROPONIN I (HIGH SENSITIVITY): Troponin I (High Sensitivity): 3 ng/L (ref <18)

## 2019-02-27 MED ORDER — ALBUTEROL SULFATE HFA 108 (90 BASE) MCG/ACT IN AERS
2.0000 | INHALATION_SPRAY | Freq: Once | RESPIRATORY_TRACT | Status: AC
  Administered 2019-02-27: 2 via RESPIRATORY_TRACT
  Filled 2019-02-27: qty 6.7

## 2019-02-27 MED ORDER — IBUPROFEN 400 MG PO TABS
400.0000 mg | ORAL_TABLET | Freq: Once | ORAL | Status: AC
  Administered 2019-02-27: 04:00:00 400 mg via ORAL
  Filled 2019-02-27: qty 1

## 2019-02-27 MED ORDER — PREDNISONE 20 MG PO TABS: ORAL_TABLET | ORAL | 0 refills | Status: DC

## 2019-02-27 MED ORDER — PREDNISONE 20 MG PO TABS: 60.0000 mg | ORAL_TABLET | Freq: Once | ORAL | Status: DC

## 2019-02-27 NOTE — ED Notes (Signed)
Patient verbalizes understanding of discharge instructions. Opportunity for questioning and answers were provided. Armband removed by staff, pt discharged from ED ambulatory.   

## 2019-02-27 NOTE — ED Triage Notes (Signed)
Patient reports central chest pain with SOB /dry cough onset today , denies fever or chills .

## 2019-02-27 NOTE — Discharge Instructions (Addendum)
Use the inhaler - two puffs, every four hours as needed for cough or difficulty breathing.  Return if symptoms are getting worse.

## 2019-02-27 NOTE — ED Provider Notes (Addendum)
[place] EMERGENCY DEPARTMENT Provider Note   CSN: [id number] Arrival date & time: 02/27/19  0159    History   Chief Complaint Chief Complaint  Patient presents with  . Chest Pain    HPI Ricardo Randall is a 23 y.o. male.   The history is provided by the patient.  Chest Pain He had onset this morning of nonproductive cough and dull pain in the lower sternal area when he coughs.  Pain is rated at 2/10.  There is no radiation of pain.  He denies dyspnea, nausea, diaphoresis.  He denies fever or chills.  He denies change in sense of smell or taste.  He denies arthralgias or myalgias other than chronic back pain which is unchanged from baseline.  He has no no treatment.  Denies any sick contacts.  He specifically denies exposure to anyone with COVID-19.  He does smoke cigarettes.  There is no history of hypertension, diabetes, hyperlipidemia.  There is no family history of premature coronary atherosclerosis.  History reviewed. No pertinent past medical history.  There are no active problems to display for this patient.   Past Surgical History:  Procedure Laterality Date  . TONSILLECTOMY          Home Medications    Prior to Admission medications   Not on File    Family History No family history on file.  Social History Social History   Tobacco Use  . Smoking status: Current Every Day Smoker    Packs/day: 1.00    Types: Cigarettes  . Smokeless tobacco: Current User  Substance Use Topics  . Alcohol use: Yes  . Drug use: No     Allergies   Patient has no known allergies.   Review of Systems Review of Systems  Cardiovascular: Positive for chest pain.  All other systems reviewed and are negative.    Physical Exam Updated Vital Signs BP (!) 139/93 (BP Location: Right Arm)   Pulse (!) 113   Temp 99.5 F (37.5 C) (Oral)   Resp 18   Ht 5\' 9"  (1.753 m)   Wt 136.1 kg   SpO2 96%   BMI 44.30 kg/m   Physical Exam Vitals signs and  nursing note reviewed.    Morbidly obese 23 year old male, resting comfortably and in no acute distress. Vital signs are significant for borderline elevated heart rate. Oxygen saturation is 96%, which is normal. Head is normocephalic and atraumatic. PERRLA, EOMI. Oropharynx is clear. Neck is nontender and supple without adenopathy or JVD. Back is nontender and there is no CVA tenderness. Lungs are clear without rales, wheezes, or rhonchi. Chest is nontender. Heart has regular rate and rhythm without murmur. Abdomen is soft, flat, nontender without masses or hepatosplenomegaly and peristalsis is normoactive. Extremities have trace edema, full range of motion is present. Skin is warm and dry without rash. Neurologic: Mental status is normal, cranial nerves are intact, there are no motor or sensory deficits.  ED Treatments / Results  Labs (all labs ordered are listed, but only abnormal results are displayed) Labs Reviewed  BASIC METABOLIC PANEL  CBC  D-DIMER, QUANTITATIVE (NOT AT [facility])  TROPONIN I (HIGH SENSITIVITY)  TROPONIN I (HIGH SENSITIVITY)    EKG EKG Interpretation  Date/Time:  Thursday February 27 2019 02:09:57 EDT Ventricular Rate:  106 PR Interval:  154 QRS Duration: 94 QT Interval:  330 QTC Calculation: 438 R Axis:   -3 Text Interpretation:  Sinus tachycardia Otherwise normal ECG No old tracing  to compare Confirmed by Dione BoozeGlick, Levie Wages ([id number]) on 02/27/2019 2:25:49 AM   Radiology Dg Chest 2 View  Result Date: 02/27/2019 CLINICAL DATA:  Chest pain, short of breath EXAM: CHEST - 2 VIEW COMPARISON:  None. FINDINGS: The heart size and mediastinal contours are within normal limits. Both lungs are clear. The visualized skeletal structures are unremarkable. IMPRESSION: No active cardiopulmonary disease. Electronically Signed   By: Jasmine PangKim  Fujinaga M.D.   On: 02/27/2019 02:31    Procedures Procedures  Medications Ordered in ED Medications  predniSONE (DELTASONE) tablet 60 mg (has  no administration in time range)  ibuprofen (ADVIL) tablet 400 mg (400 mg Oral Given 02/27/19 0403)  albuterol (VENTOLIN HFA) 108 (90 Base) MCG/ACT inhaler 2 puff (2 puffs Inhalation Given 02/27/19 0403)     Initial Impression / Assessment and Plan / ED Course  I have reviewed the triage vital signs and the nursing notes.  Pertinent labs & imaging results that were available during my care of the patient were reviewed by me and considered in my medical decision making (see chart for details).  Cough and mild chest pain with cough, probable viral bronchitis.  No fever or sputum production or dyspnea to suggest pneumonia.  No history of sick contacts to suggest COVID-19.  Chest x-ray shows no evidence of pneumonia.  ECG is significant only for sinus tachycardia.  Unfortunately, because of tachycardia, pulmonary embolism cannot be ruled out by [facility] criteria.  Will screen with d-dimer.  Old records are reviewed, and he has no relevant past visits.  Heart score is 1, which puts him at very low risk for major adverse cardiac events in the next 6 weeks.  Initial troponin is minimally elevated.  Will check repeat troponin and will also give dose of ibuprofen for pain.  We will give therapeutic trial of albuterol inhaler to see if it improves his cough.  D-dimer is normal.  He had significant relief of cough with albuterol inhaler and is advised to continue using inhaler as needed.  He is given a dose of prednisone and sent home with prescription for same.  Return precautions discussed.  Ricardo Randall was evaluated in Emergency Department on 02/27/2019 for the symptoms described in the history of present illness. He was evaluated in the context of the global COVID-19 pandemic, which necessitated consideration that the patient might be at risk for infection with the SARS-CoV-2 virus that causes COVID-19. Institutional protocols and algorithms that pertain to the evaluation of patients at risk for COVID-19 are in a  state of rapid change based on information released by regulatory bodies including the CDC and federal and state organizations. These policies and algorithms were followed during the patient's care in the ED.  Final Clinical Impressions(s) / ED Diagnoses   Final diagnoses:  Nonspecific chest pain  Cough    ED Discharge Orders         Ordered    predniSONE (DELTASONE) 20 MG tablet     02/27/19 0545           Dione BoozeGlick, [place] Warriner, MD 02/27/19 [id number]    Dione BoozeGlick, Zaia Carre, MD 02/27/19 [phone]

## 2020-10-11 ENCOUNTER — Emergency Department (HOSPITAL_COMMUNITY)
Admission: EM | Admit: 2020-10-11 | Discharge: 2020-10-11 | Disposition: A | Discharge status: Home / Self Care | Payer: Medicaid Other | Attending: Emergency Medicine | Admitting: Emergency Medicine

## 2020-10-11 ENCOUNTER — Encounter (HOSPITAL_COMMUNITY): Payer: Self-pay | Admitting: Student

## 2020-10-11 ENCOUNTER — Other Ambulatory Visit: Payer: Self-pay

## 2020-10-11 DIAGNOSIS — R110 Nausea: Secondary | ICD-10-CM | POA: Insufficient documentation

## 2020-10-11 DIAGNOSIS — F17210 Nicotine dependence, cigarettes, uncomplicated: Secondary | ICD-10-CM | POA: Insufficient documentation

## 2020-10-11 DIAGNOSIS — R519 Headache, unspecified: Secondary | ICD-10-CM

## 2020-10-11 MED ORDER — KETOROLAC TROMETHAMINE 15 MG/ML IJ SOLN
15.0000 mg | Freq: Once | INTRAMUSCULAR | Status: AC
  Administered 2020-10-11: 15 mg via INTRAVENOUS
  Filled 2020-10-11: qty 1

## 2020-10-11 MED ORDER — PROCHLORPERAZINE EDISYLATE 10 MG/2ML IJ SOLN
10.0000 mg | Freq: Once | INTRAMUSCULAR | Status: AC
  Administered 2020-10-11: 10 mg via INTRAVENOUS
  Filled 2020-10-11: qty 2

## 2020-10-11 MED ORDER — DIPHENHYDRAMINE HCL 50 MG/ML IJ SOLN
12.5000 mg | Freq: Once | INTRAMUSCULAR | Status: AC
  Administered 2020-10-11: 12.5 mg via INTRAVENOUS
  Filled 2020-10-11: qty 1

## 2020-10-11 NOTE — ED Triage Notes (Signed)
Pt arrived via ems from home due to a headache and n/v. Pt states he was smoking a cigar when the headache developed. Describes pain as constant throbbing.

## 2020-10-11 NOTE — ED Provider Notes (Signed)
[facility] EMERGENCY DEPARTMENT Provider Note   CSN: [id number] Arrival date & time: 10/11/20  0423     History Chief Complaint  Patient presents with   Headache    Ricardo Randall is a 25 y.o. male with a hx of tobacco abuse who presents to the ED with complaints of a headache that started 3-4 hours PTA. Patient reports gradual onset headache with steady progression, located in the frontal area, no alleviating/aggravating factors, no intervention PTA. Reports associated nausea. Onset when smoking a black and mild. Also did take a percocet tablet earlier tonight- uses these sometimes. Does not report, visual disturbance, numbness, weakness, dizziness, vomiting, fever, or neck stiffness. Denies any additional drug use. States he has a hx of similar headaches/migraines.   HPI     History reviewed. No pertinent past medical history.  There are no problems to display for this patient.   Past Surgical History:  Procedure Laterality Date   TONSILLECTOMY         History reviewed. No pertinent family history.  Social History   Tobacco Use   Smoking status: Current Every Day Smoker    Packs/day: 1.00    Types: Cigarettes   Smokeless tobacco: Current User  Substance Use Topics   Alcohol use: Yes   Drug use: No    Home Medications Prior to Admission medications   Medication Sig Start Date End Date Taking? Authorizing Provider  predniSONE (DELTASONE) 20 MG tablet 2 tabs po daily x 4 days 02/27/19   Dione Booze, MD    Allergies    Patient has no known allergies.  Review of Systems   Review of Systems  Constitutional: Negative for chills and fever.  Eyes: Negative for visual disturbance.  Respiratory: Negative for shortness of breath.   Cardiovascular: Negative for chest pain.  Gastrointestinal: Positive for nausea. Negative for abdominal pain and vomiting.  Neurological: Positive for headaches. Negative for dizziness, seizures, syncope, facial  asymmetry, speech difficulty, weakness and numbness.  All other systems reviewed and are negative.   Physical Exam Updated Vital Signs BP 126/89 (BP Location: Right Arm)    Pulse 92    Temp 98.5 F (36.9 C) (Oral)    Resp 20    Ht 5\' 9"  (1.753 m)    Wt 136.1 kg    SpO2 98%    BMI 44.30 kg/m   Physical Exam Vitals and nursing note reviewed.  Constitutional:      General: He is not in acute distress.    Appearance: Normal appearance. He is not toxic-appearing.  HENT:     Head: Normocephalic and atraumatic.     Mouth/Throat:     Pharynx: Oropharynx is clear. Uvula midline.  Eyes:     General: Vision grossly intact. Gaze aligned appropriately.     Extraocular Movements: Extraocular movements intact.     Conjunctiva/sclera: Conjunctivae normal.     Pupils: Pupils are equal, round, and reactive to light.     Comments: No proptosis.   Cardiovascular:     Rate and Rhythm: Normal rate and regular rhythm.  Pulmonary:     Effort: Pulmonary effort is normal.     Breath sounds: Normal breath sounds.  Abdominal:     General: There is no distension.     Palpations: Abdomen is soft.     Tenderness: There is no abdominal tenderness. There is no guarding or rebound.  Musculoskeletal:     Cervical back: Normal range of motion and neck supple. No  rigidity.  Skin:    General: Skin is warm and dry.  Neurological:     Mental Status: He is alert.     Comments: Alert. Clear speech. No facial droop. CNIII-XII grossly intact. Bilateral upper and lower extremities' sensation grossly intact. 5/5 symmetric strength with grip strength and with plantar and dorsi flexion bilaterally . Normal finger to nose bilaterally. Negative pronator drift. Gait intact.    Psychiatric:        Mood and Affect: Mood normal.        Behavior: Behavior normal.     ED Results / Procedures / Treatments   Labs (all labs ordered are listed, but only abnormal results are displayed) Labs Reviewed - No data to  display  EKG None  Radiology No results found.  Procedures Procedures   Medications Ordered in ED Medications  prochlorperazine (COMPAZINE) injection 10 mg (10 mg Intravenous Given 10/11/20 0458)  diphenhydrAMINE (BENADRYL) injection 12.5 mg (12.5 mg Intravenous Given 10/11/20 0457)  ketorolac (TORADOL) 15 MG/ML injection 15 mg (15 mg Intravenous Given 10/11/20 0457)    ED Course  I have reviewed the triage vital signs and the nursing notes.  Pertinent labs & imaging results that were available during my care of the patient were reviewed by me and considered in my medical decision making (see chart for details).    MDM Rules/Calculators/A&P                         Patient presents with complaint of headache. Patient is nontoxic appearing with stable vital signs. Patient has hx of similar headaches, gradual onset with steady progression in severity, no neuro deficit, no dizziness, no visual disturbance, afebrile, no nuchal rigidity- low suspicion for [facility], ICH, ischemic CVA, dural venous sinus thrombosis, acute glaucoma, giant cell arteritis, mass, or meningitis. Patient treated for headache with migraine cocktail with improvement. I discussed treatment plan, need for PCP follow-up, and return precautions with the patient. Provided opportunity for questions, patient confirmed understanding and is in agreement with plan.   Final Clinical Impression(s) / ED Diagnoses Final diagnoses:  Acute nonintractable headache, unspecified headache type    Rx / DC Orders ED Discharge Orders    None       Desmond Lope 10/11/20 0603    Palumbo, April, MD 10/11/20 [phone]

## 2020-10-11 NOTE — Discharge Instructions (Addendum)
You were seen in the ER today for a headache. You were given a migraine cocktail with improvement.   Take motrin/tylenol per over the counter dosing as needed for headache.   Please do not take percocet unless prescribed to you as this can be very dangerous.   Follow up with your primary care provider within 1 week. We have also provided neurology information should this become a recurrent problem. Return to the ER for new or worsening symptoms including but not limited to fever, neck stiffness, new or worsening pain, sudden change in pain, numbness, weakness, change in your vision, or any other concerns.

## 2020-10-25 ENCOUNTER — Emergency Department (HOSPITAL_COMMUNITY)
Admission: EM | Admit: 2020-10-25 | Discharge: 2020-10-25 | Disposition: A | Discharge status: Home / Self Care | Payer: Self-pay | Attending: Emergency Medicine | Admitting: Emergency Medicine

## 2020-10-25 ENCOUNTER — Emergency Department (HOSPITAL_COMMUNITY): Payer: Self-pay

## 2020-10-25 DIAGNOSIS — F1110 Opioid abuse, uncomplicated: Secondary | ICD-10-CM | POA: Insufficient documentation

## 2020-10-25 DIAGNOSIS — R0602 Shortness of breath: Secondary | ICD-10-CM | POA: Insufficient documentation

## 2020-10-25 DIAGNOSIS — F17210 Nicotine dependence, cigarettes, uncomplicated: Secondary | ICD-10-CM | POA: Insufficient documentation

## 2020-10-25 DIAGNOSIS — D72829 Elevated white blood cell count, unspecified: Secondary | ICD-10-CM | POA: Insufficient documentation

## 2020-10-25 LAB — COMPREHENSIVE METABOLIC PANEL
ALT: 36 U/L (ref 0–44)
AST: 24 U/L (ref 15–41)
Albumin: 4.3 g/dL (ref 3.5–5.0)
Alkaline Phosphatase: 64 U/L (ref 38–126)
Anion gap: 12 (ref 5–15)
BUN: 7 mg/dL (ref 6–20)
CO2: 23 mmol/L (ref 22–32)
Calcium: 9 mg/dL (ref 8.9–10.3)
Chloride: 102 mmol/L (ref 98–111)
Creatinine, Ser: 0.7 mg/dL (ref 0.61–1.24)
GFR, Estimated: >60 mL/min (ref >60)
Glucose, Bld: 113 mg/dL — ABNORMAL HIGH (ref 70–99)
Potassium: 3.8 mmol/L (ref 3.5–5.1)
Sodium: 137 mmol/L (ref 135–145)
Total Bilirubin: 0.8 mg/dL (ref 0.3–1.2)
Total Protein: 7.6 g/dL (ref 6.5–8.1)

## 2020-10-25 LAB — CBC WITH DIFFERENTIAL/PLATELET
Abs Immature Granulocytes: 0.04 10*3/uL (ref 0.00–0.07)
Basophils Absolute: 0 10*3/uL (ref 0.0–0.1)
Basophils Relative: 0 %
Eosinophils Absolute: 0.2 10*3/uL (ref 0.0–0.5)
Eosinophils Relative: 1 %
HCT: 49.1 % (ref 39.0–52.0)
Hemoglobin: 16.7 g/dL (ref 13.0–17.0)
Immature Granulocytes: 0 %
Lymphocytes Relative: 26 %
Lymphs Abs: 3.1 10*3/uL (ref 0.7–4.0)
MCH: 31 pg (ref 26.0–34.0)
MCHC: 34 g/dL (ref 30.0–36.0)
MCV: 91.1 fL (ref 80.0–100.0)
Monocytes Absolute: 0.7 10*3/uL (ref 0.1–1.0)
Monocytes Relative: 6 %
Neutro Abs: 7.9 10*3/uL — ABNORMAL HIGH (ref 1.7–7.7)
Neutrophils Relative %: 67 %
Platelets: 268 10*3/uL (ref 150–400)
RBC: 5.39 MIL/uL (ref 4.22–5.81)
RDW: 12.4 % (ref 11.5–15.5)
WBC: 11.9 10*3/uL — ABNORMAL HIGH (ref 4.0–10.5)
nRBC: 0 % (ref 0.0–0.2)

## 2020-10-25 LAB — SALICYLATE LEVEL: Salicylate Lvl: <7 mg/dL — ABNORMAL LOW (ref 7.0–30.0)

## 2020-10-25 LAB — RAPID URINE DRUG SCREEN, HOSP PERFORMED
Amphetamines: NOT DETECTED
Barbiturates: NOT DETECTED
Benzodiazepines: NOT DETECTED
Cocaine: NOT DETECTED
Opiates: POSITIVE — AB
Tetrahydrocannabinol: NOT DETECTED

## 2020-10-25 LAB — ACETAMINOPHEN LEVEL: Acetaminophen (Tylenol), Serum: <10 ug/mL — ABNORMAL LOW (ref 10–30)

## 2020-10-25 LAB — ETHANOL: Alcohol, Ethyl (B): <10 mg/dL (ref <10)

## 2020-10-25 NOTE — Discharge Instructions (Signed)
At this time there does not appear to be the presence of an emergent medical condition, however there is always the potential for conditions to change. Please read and follow the below instructions.  Please return to the Emergency Department immediately for any new or worsening symptoms. Please be sure to follow up with your Primary Care Provider within one week.  If you do not have a primary care provider you may call [facility] to establish one. Do not take medications that are not prescribed to you.  Using pain pills such as Percocet can be very dangerous because they make you drowsy and can make you stop breathing.  Do not try to take any more Percocet to get high.  Do not take any medications that are not prescribed to you by medical professional. Try to quit smoking.  Call 911 and go to the nearest Emergency Department immediately if: You have fever or chills You may have taken too much of an opioid (overdosed). Common symptoms of an overdose include: Sleepiness or difficulty waking from sleep. Decrease in attention. Confusion. Slurred speech. Slowed breathing and a slow pulse (bradycardia). Nausea and vomiting. Abnormally small pupils. You have serious thoughts about hurting yourself or others. You feel light-headed or you pass out (faint). You have a cough that is not helped by medicines. You cough up blood. You have pain with breathing. You have pain in your chest, arms, shoulders, or belly (abdomen). You have any new/concerning or worsening of symptoms.  Please read the additional information packets attached to your discharge summary.  Do not take your medicine if  develop an itchy rash, swelling in your mouth or lips, or difficulty breathing; call 911 and seek immediate emergency medical attention if this occurs.  You may review your lab tests and imaging results in their entirety on your MyChart account.  Please discuss all results of fully  with your primary care provider and other specialist at your follow-up visit.  Note: Portions of this text may have been transcribed using voice recognition software. Every effort was made to ensure accuracy; however, inadvertent computerized transcription errors may still be present.

## 2020-10-25 NOTE — ED Notes (Signed)
Pt refused covid test

## 2020-10-25 NOTE — ED Triage Notes (Signed)
Pt states he took 3 10mg  percocet about 9 hours ago. States he started feeling some shortness of breath and could not go to sleep.

## 2020-10-25 NOTE — ED Notes (Signed)
Urine requested  ?

## 2020-10-25 NOTE — ED Provider Notes (Signed)
[facility]-EMERGENCY DEPT Provider Note   CSN: [id number] Arrival date & time: 10/25/20  0809     History Chief Complaint  Patient presents with  . Drug Overdose    Ricardo Randall is a 25 y.o. male history of tonsillectomy, obesity, smoker.  Otherwise healthy no daily medication use.  Patient presents today for concern of overdose.  He always with friends last night, I take 3 pills of Percocet 10 mg around 10 PM in order to get high.  He reports that he was not trying to harm himself with his medication.  He felt well last night but when he woke up this morning he felt some shortness of breath.  He googled his symptoms and saw that this could be a side effect of an overdose so he came to the ER for evaluation.  Patient denies fever/chills, recent illness, rash, throat swelling, chest pain, pleurisy, cough/hemoptysis, abdominal pain, nausea/vomiting, extremity swelling/color change, history of blood clot, suicidal/homicidal ideations, visual/auditory hallucinations or any additional concerns.  Additionally he denies any other drug or alcohol use.  HPI     No past medical history on file.  There are no problems to display for this patient.   Past Surgical History:  Procedure Laterality Date  . TONSILLECTOMY         No family history on file.  Social History   Tobacco Use  . Smoking status: Current Every Day Smoker    Packs/day: 1.00    Types: Cigarettes  . Smokeless tobacco: Current User  Substance Use Topics  . Alcohol use: Yes  . Drug use: No    Home Medications Prior to Admission medications   Not on File    Allergies    Patient has no known allergies.  Review of Systems   Review of Systems Ten systems are reviewed and are negative for acute change except as noted in the HPI  Physical Exam Updated Vital Signs BP 127/71   Pulse 71   Temp 98 F (36.7 C) (Oral)   Resp 18   Ht 5\' 10"  (1.778 m)   Wt (!) 154.2 kg   SpO2 98%   BMI  48.78 kg/m   Physical Exam Constitutional:      General: He is not in acute distress.    Appearance: Normal appearance. He is well-developed. He is obese. He is not ill-appearing or diaphoretic.  HENT:     Head: Normocephalic and atraumatic.  Eyes:     General: Vision grossly intact. Gaze aligned appropriately.     Pupils: Pupils are equal, round, and reactive to light.  Neck:     Trachea: Trachea and phonation normal.  Cardiovascular:     Rate and Rhythm: Normal rate and regular rhythm.  Pulmonary:     Effort: Pulmonary effort is normal. No respiratory distress.     Breath sounds: Normal breath sounds.  Abdominal:     General: There is no distension.     Palpations: Abdomen is soft.     Tenderness: There is no abdominal tenderness. There is no guarding or rebound.  Musculoskeletal:        General: Normal range of motion.     Cervical back: Normal range of motion.     Right lower leg: No edema.     Left lower leg: No edema.  Skin:    General: Skin is warm and dry.  Neurological:     Mental Status: He is alert.     GCS: GCS  eye subscore is 4. GCS verbal subscore is 5. GCS motor subscore is 6.     Comments: Speech is clear and goal oriented, follows commands Major Cranial nerves without deficit, no facial droop Moves extremities without ataxia, coordination intact  Psychiatric:        Behavior: Behavior normal.     ED Results / Procedures / Treatments   Labs (all labs ordered are listed, but only abnormal results are displayed) Labs Reviewed  COMPREHENSIVE METABOLIC PANEL - Abnormal; Notable for the following components:      Result Value   Glucose, Bld 113 (*)    All other components within normal limits  RAPID URINE DRUG SCREEN, HOSP PERFORMED - Abnormal; Notable for the following components:   Opiates POSITIVE (*)    All other components within normal limits  CBC WITH DIFFERENTIAL/PLATELET - Abnormal; Notable for the following components:   WBC 11.9 (*)     Neutro Abs 7.9 (*)    All other components within normal limits  ACETAMINOPHEN LEVEL - Abnormal; Notable for the following components:   Acetaminophen (Tylenol), Serum <10 (*)    All other components within normal limits  SALICYLATE LEVEL - Abnormal; Notable for the following components:   Salicylate Lvl <7.0 (*)    All other components within normal limits  RESP PANEL BY RT-PCR (FLU A&B, COVID) [id number]  ETHANOL    EKG EKG Interpretation  Date/Time:  Monday October 25 2020 08:19:44 EDT Ventricular Rate:  69 PR Interval:    QRS Duration: 98 QT Interval:  421 QTC Calculation: 451 R Axis:   23 Text Interpretation: Sinus rhythm Confirmed by Bethann Berkshire [id number]) on 10/25/2020 11:14:02 AM   Radiology DG Chest Portable 1 View  Result Date: 10/25/2020 CLINICAL DATA:  25 year old male with shortness of breath. EXAM: PORTABLE CHEST 1 VIEW COMPARISON:  Chest radiographs 02/27/2019. FINDINGS: Portable AP semi upright view at 1015 hours. Mildly lower lung volumes. Mediastinal contours remain normal. Visualized tracheal air column is within normal limits. Allowing for portable technique the lungs are clear. No pneumothorax. No osseous abnormality identified. IMPRESSION: Negative portable chest. Electronically Signed   By: Odessa Fleming M.D.   On: 10/25/2020 10:29    Procedures Procedures   Medications Ordered in ED Medications - No data to display  ED Course  I have reviewed the triage vital signs and the nursing notes.  Pertinent labs & imaging results that were available during my care of the patient were reviewed by me and considered in my medical decision making (see chart for details).    MDM Rules/Calculators/A&P                         Additional history obtained from: 1. Nursing notes from this visit. 2. Review electronic medical records. -------------- 25 year old male presents today for some shortness of breath. Patient reports he was with his friend last night and was trying to  get high by taking 3 Percocet 10 mg.  He reports noticed some mild shortness of breath, googled his symptoms and was concerned that he may have overdosed accidentally.  On arrival vital signs are stable he is well-appearing no acute distress denies any pain or recent infectious symptoms.  He is low risk by Wells criteria and PERC negative.  He googled his symptoms today and was concerned this may be related to narcotic overdose.  He denies any attempted self-harm, SI/HI, hallucinations or any additional concerns today.  Will obtain medical clearance labs  EKG and chest x-ray.  Given he took a total of 30 mg oxycodone and only 1 g of Tylenol over 10 hours ago should be metabolized do not feel consultation with poison control is indicated.  Additionally patient with no evidence for allergic reaction. - I ordered, reviewed and interpreted labs which include: UDS positive for opioids, expected, negative for all other substances. Tylenol level negative, no indication for delta acetaminophen level. Salicylate negative. Ethanol negative. CBC shows mild leukocytosis of 11.9, no anemia. CMP shows no emergent electrolyte derangement, AKI, LFT elevations or gap Patient refused Covid/influenza panel, he denies any infectious symptoms feel this is reasonable.  CXR:    IMPRESSION:  Negative portable chest.   EKG: Sinus rhythm Confirmed by Bethann Berkshire [phone]) on 10/25/2020 11:14:02 AM - Patient reassessed he is resting comfortably no acute distress he reports he is feeling much better and would like to go home.  Vital signs are within normal limits.  No evidence for ACS, PE, dissection, pneumonia, overdose or other emergent pathologies at this time.  Possible anxiety contributing to patient's symptoms today?  Patient did decline COVID/influenza testing today but less patient for viral pathology given lack of symptoms.  As to patient's concern overdose, his 3 pills of oxycodone 10mg   were taken over 12 hours ago  at this point.  He was attempting to get high today no attempts and self-harm, no indication for psychiatric evaluation at this time.  I have advised patient to not take his friends medications and I discussed with him the risks of drug overdose and he states understanding.  There is no indication further ER work-up at this time. Patient's case discussed with Dr. who agrees with plan to discharge with follow-up.   Discussed smoking cessation with patient and was they were offerred resources to help stop.  Total time was 5 min CPT code Estell Harpin.   At this time there does not appear to be any evidence of an acute emergency medical condition and the patient appears stable for discharge with appropriate outpatient follow up. Diagnosis was discussed with patient who verbalizes understanding of care plan and is agreeable to discharge. I have discussed return precautions with patient who verbalizes understanding. Patient encouraged to follow-up with their PCP. All questions answered.  Note: Portions of this report may have been transcribed using voice recognition software. Every effort was made to ensure accuracy; however, inadvertent computerized transcription errors may still be present. Final Clinical Impression(s) / ED Diagnoses Final diagnoses:  Percocet use disorder, mild, abuse [facility])    Rx / DC Orders ED Discharge Orders    None       [facility] 10/25/20 1132    10/27/20, MD 10/26/20 [phone]

## 2021-03-14 ENCOUNTER — Inpatient Hospital Stay (HOSPITAL_COMMUNITY): Payer: Self-pay

## 2021-03-14 ENCOUNTER — Emergency Department (HOSPITAL_COMMUNITY): Payer: Self-pay

## 2021-03-14 ENCOUNTER — Inpatient Hospital Stay (HOSPITAL_COMMUNITY)
Admission: EM | Admit: 2021-03-14 | Discharge: 2021-03-20 | DRG: 964 | Disposition: A | Discharge status: Home / Self Care | Payer: Self-pay | Attending: Surgery | Admitting: Surgery

## 2021-03-14 DIAGNOSIS — Z4682 Encounter for fitting and adjustment of non-vascular catheter: Secondary | ICD-10-CM

## 2021-03-14 DIAGNOSIS — D62 Acute posthemorrhagic anemia: Secondary | ICD-10-CM | POA: Diagnosis present

## 2021-03-14 DIAGNOSIS — Z23 Encounter for immunization: Secondary | ICD-10-CM

## 2021-03-14 DIAGNOSIS — S272XXA Traumatic hemopneumothorax, initial encounter: Secondary | ICD-10-CM | POA: Diagnosis present

## 2021-03-14 DIAGNOSIS — J942 Hemothorax: Secondary | ICD-10-CM

## 2021-03-14 DIAGNOSIS — Z6841 Body Mass Index (BMI) 40.0 and over, adult: Secondary | ICD-10-CM

## 2021-03-14 DIAGNOSIS — E871 Hypo-osmolality and hyponatremia: Secondary | ICD-10-CM | POA: Diagnosis present

## 2021-03-14 DIAGNOSIS — S2231XA Fracture of one rib, right side, initial encounter for closed fracture: Secondary | ICD-10-CM | POA: Diagnosis present

## 2021-03-14 DIAGNOSIS — J9690 Respiratory failure, unspecified, unspecified whether with hypoxia or hypercapnia: Secondary | ICD-10-CM

## 2021-03-14 DIAGNOSIS — S21131A Puncture wound without foreign body of right front wall of thorax without penetration into thoracic cavity, initial encounter: Principal | ICD-10-CM | POA: Diagnosis present

## 2021-03-14 DIAGNOSIS — S27329A Contusion of lung, unspecified, initial encounter: Secondary | ICD-10-CM | POA: Diagnosis present

## 2021-03-14 DIAGNOSIS — S37813A Laceration of adrenal gland, initial encounter: Secondary | ICD-10-CM | POA: Diagnosis present

## 2021-03-14 DIAGNOSIS — E872 Acidosis: Secondary | ICD-10-CM | POA: Diagnosis present

## 2021-03-14 DIAGNOSIS — Z20822 Contact with and (suspected) exposure to covid-19: Secondary | ICD-10-CM | POA: Diagnosis present

## 2021-03-14 DIAGNOSIS — J939 Pneumothorax, unspecified: Secondary | ICD-10-CM

## 2021-03-14 DIAGNOSIS — I959 Hypotension, unspecified: Secondary | ICD-10-CM | POA: Diagnosis present

## 2021-03-14 DIAGNOSIS — S37051A Moderate laceration of right kidney, initial encounter: Secondary | ICD-10-CM | POA: Diagnosis present

## 2021-03-14 DIAGNOSIS — W3400XA Accidental discharge from unspecified firearms or gun, initial encounter: Secondary | ICD-10-CM

## 2021-03-14 DIAGNOSIS — S36116A Major laceration of liver, initial encounter: Secondary | ICD-10-CM | POA: Diagnosis present

## 2021-03-14 LAB — CBC
HCT: 43.7 % (ref 39.0–52.0)
HCT: 42.5 % (ref 39.0–52.0)
Hemoglobin: 15.3 g/dL (ref 13.0–17.0)
Hemoglobin: 14.8 g/dL (ref 13.0–17.0)
MCH: 31.5 pg (ref 26.0–34.0)
MCH: 31.3 pg (ref 26.0–34.0)
MCHC: 35 g/dL (ref 30.0–36.0)
MCHC: 34.8 g/dL (ref 30.0–36.0)
MCV: 89.9 fL (ref 80.0–100.0)
MCV: 89.9 fL (ref 80.0–100.0)
Platelets: 296 10*3/uL (ref 150–400)
Platelets: 253 10*3/uL (ref 150–400)
RBC: 4.86 MIL/uL (ref 4.22–5.81)
RBC: 4.73 MIL/uL (ref 4.22–5.81)
RDW: 12.2 % (ref 11.5–15.5)
RDW: 12.3 % (ref 11.5–15.5)
WBC: 18.6 10*3/uL — ABNORMAL HIGH (ref 4.0–10.5)
WBC: 23.1 10*3/uL — ABNORMAL HIGH (ref 4.0–10.5)
nRBC: 0 % (ref 0.0–0.2)
nRBC: 0 % (ref 0.0–0.2)

## 2021-03-14 LAB — COMPREHENSIVE METABOLIC PANEL
ALT: 184 U/L — ABNORMAL HIGH (ref 0–44)
ALT: 263 U/L — ABNORMAL HIGH (ref 0–44)
AST: 196 U/L — ABNORMAL HIGH (ref 15–41)
AST: 254 U/L — ABNORMAL HIGH (ref 15–41)
Albumin: 3.8 g/dL (ref 3.5–5.0)
Albumin: 3.6 g/dL (ref 3.5–5.0)
Alkaline Phosphatase: 57 U/L (ref 38–126)
Alkaline Phosphatase: 53 U/L (ref 38–126)
Anion gap: 8 (ref 5–15)
Anion gap: 8 (ref 5–15)
BUN: 11 mg/dL (ref 6–20)
BUN: 9 mg/dL (ref 6–20)
CO2: 22 mmol/L (ref 22–32)
CO2: 23 mmol/L (ref 22–32)
Calcium: 9 mg/dL (ref 8.9–10.3)
Calcium: 8.7 mg/dL — ABNORMAL LOW (ref 8.9–10.3)
Chloride: 107 mmol/L (ref 98–111)
Chloride: 106 mmol/L (ref 98–111)
Creatinine, Ser: 1.05 mg/dL (ref 0.61–1.24)
Creatinine, Ser: 0.86 mg/dL (ref 0.61–1.24)
GFR, Estimated: >60 mL/min (ref >60)
GFR, Estimated: >60 mL/min (ref >60)
Glucose, Bld: 138 mg/dL — ABNORMAL HIGH (ref 70–99)
Glucose, Bld: 159 mg/dL — ABNORMAL HIGH (ref 70–99)
Potassium: 3.4 mmol/L — ABNORMAL LOW (ref 3.5–5.1)
Potassium: 4 mmol/L (ref 3.5–5.1)
Sodium: 137 mmol/L (ref 135–145)
Sodium: 137 mmol/L (ref 135–145)
Total Bilirubin: 0.7 mg/dL (ref 0.3–1.2)
Total Bilirubin: 0.9 mg/dL (ref 0.3–1.2)
Total Protein: 7 g/dL (ref 6.5–8.1)
Total Protein: 6.6 g/dL (ref 6.5–8.1)

## 2021-03-14 LAB — URINALYSIS, ROUTINE W REFLEX MICROSCOPIC
Bacteria, UA: NONE SEEN
Bilirubin Urine: NEGATIVE
Glucose, UA: NEGATIVE mg/dL
Ketones, ur: NEGATIVE mg/dL
Leukocytes,Ua: NEGATIVE
Nitrite: NEGATIVE
Protein, ur: NEGATIVE mg/dL
Specific Gravity, Urine: 1.033 — ABNORMAL HIGH (ref 1.005–1.030)
pH: 5 (ref 5.0–8.0)

## 2021-03-14 LAB — SAMPLE TO BLOOD BANK

## 2021-03-14 LAB — I-STAT CHEM 8, ED
BUN: 11 mg/dL (ref 6–20)
Calcium, Ion: 1.07 mmol/L — ABNORMAL LOW (ref 1.15–1.40)
Chloride: 106 mmol/L (ref 98–111)
Creatinine, Ser: 0.9 mg/dL (ref 0.61–1.24)
Glucose, Bld: 135 mg/dL — ABNORMAL HIGH (ref 70–99)
HCT: 43 % (ref 39.0–52.0)
Hemoglobin: 14.6 g/dL (ref 13.0–17.0)
Potassium: 3.3 mmol/L — ABNORMAL LOW (ref 3.5–5.1)
Sodium: 140 mmol/L (ref 135–145)
TCO2: 23 mmol/L (ref 22–32)

## 2021-03-14 LAB — RESP PANEL BY RT-PCR (FLU A&B, COVID) ARPGX2
Influenza A by PCR: NEGATIVE
Influenza B by PCR: NEGATIVE
SARS Coronavirus 2 by RT PCR: NEGATIVE

## 2021-03-14 LAB — LACTIC ACID, PLASMA: Lactic Acid, Venous: 2.5 mmol/L (ref 0.5–1.9)

## 2021-03-14 LAB — HEMOGLOBIN AND HEMATOCRIT, BLOOD
HCT: 44.5 % (ref 39.0–52.0)
Hemoglobin: 15.6 g/dL (ref 13.0–17.0)

## 2021-03-14 LAB — PROTIME-INR
INR: 1 (ref 0.8–1.2)
Prothrombin Time: 13.4 seconds (ref 11.4–15.2)

## 2021-03-14 LAB — ETHANOL: Alcohol, Ethyl (B): <10 mg/dL (ref <10)

## 2021-03-14 LAB — HIV ANTIBODY (ROUTINE TESTING W REFLEX): HIV Screen 4th Generation wRfx: NONREACTIVE

## 2021-03-14 MED ORDER — IOHEXOL 300 MG/ML  SOLN
100.0000 mL | Freq: Once | INTRAMUSCULAR | Status: AC | PRN
  Administered 2021-03-14: 100 mL via INTRAVENOUS

## 2021-03-14 MED ORDER — HYDROMORPHONE HCL 1 MG/ML IJ SOLN
INTRAMUSCULAR | Status: AC
  Filled 2021-03-14: qty 2

## 2021-03-14 MED ORDER — FENTANYL CITRATE (PF) 100 MCG/2ML IJ SOLN
INTRAMUSCULAR | Status: AC | PRN
  Administered 2021-03-14: 100 ug via INTRAVENOUS

## 2021-03-14 MED ORDER — OXYCODONE HCL 5 MG PO TABS: 10.0000 mg | ORAL_TABLET | ORAL | Status: DC | PRN

## 2021-03-14 MED ORDER — TETANUS-DIPHTH-ACELL PERTUSSIS 5-2.5-18.5 LF-MCG/0.5 IM SUSY
0.5000 mL | PREFILLED_SYRINGE | Freq: Once | INTRAMUSCULAR | Status: AC
  Administered 2021-03-14: 0.5 mL via INTRAMUSCULAR
  Filled 2021-03-14: qty 0.5

## 2021-03-14 MED ORDER — LIDOCAINE-EPINEPHRINE 1 %-1:100000 IJ SOLN
INTRAMUSCULAR | Status: AC
  Filled 2021-03-14: qty 1

## 2021-03-14 MED ORDER — METHOCARBAMOL 500 MG PO TABS
1000.0000 mg | ORAL_TABLET | Freq: Three times a day (TID) | ORAL | Status: DC
  Administered 2021-03-14 – 2021-03-20 (×19): 1000 mg via ORAL
  Filled 2021-03-14 (×20): qty 2

## 2021-03-14 MED ORDER — OXYCODONE HCL 5 MG PO TABS
5.0000 mg | ORAL_TABLET | ORAL | Status: DC | PRN
  Administered 2021-03-14 – 2021-03-17 (×12): 10 mg via ORAL
  Filled 2021-03-14 (×13): qty 2

## 2021-03-14 MED ORDER — ONDANSETRON 4 MG PO TBDP: 4.0000 mg | ORAL_TABLET | Freq: Four times a day (QID) | ORAL | Status: DC | PRN

## 2021-03-14 MED ORDER — LACTATED RINGERS IV SOLN: INTRAVENOUS | Status: DC

## 2021-03-14 MED ORDER — HYDROMORPHONE HCL 1 MG/ML IJ SOLN
1.0000 mg | INTRAMUSCULAR | Status: DC | PRN
  Administered 2021-03-14 (×2): 1 mg via INTRAVENOUS
  Administered 2021-03-14 (×2): 2 mg via INTRAVENOUS
  Administered 2021-03-14: 1 mg via INTRAVENOUS
  Administered 2021-03-14 (×2): 2 mg via INTRAVENOUS
  Administered 2021-03-15: 1 mg via INTRAVENOUS
  Filled 2021-03-14: qty 2
  Filled 2021-03-14: qty 1
  Filled 2021-03-14 (×2): qty 2
  Filled 2021-03-14 (×3): qty 1

## 2021-03-14 MED ORDER — ACETAMINOPHEN 325 MG PO TABS
650.0000 mg | ORAL_TABLET | Freq: Four times a day (QID) | ORAL | Status: DC
  Administered 2021-03-14 – 2021-03-17 (×10): 650 mg via ORAL
  Filled 2021-03-14 (×11): qty 2

## 2021-03-14 MED ORDER — FENTANYL CITRATE (PF) 100 MCG/2ML IJ SOLN
INTRAMUSCULAR | Status: AC
  Filled 2021-03-14: qty 2

## 2021-03-14 MED ORDER — ONDANSETRON HCL 4 MG/2ML IJ SOLN: 4.0000 mg | Freq: Four times a day (QID) | INTRAMUSCULAR | Status: DC | PRN

## 2021-03-14 MED ORDER — DEXTROSE-NACL 5-0.9 % IV SOLN: INTRAVENOUS | Status: DC

## 2021-03-14 MED ORDER — HYDRALAZINE HCL 20 MG/ML IJ SOLN: 10.0000 mg | INTRAMUSCULAR | Status: DC | PRN

## 2021-03-14 MED ORDER — SODIUM CHLORIDE 0.9 % IV SOLN
INTRAVENOUS | Status: AC | PRN
  Administered 2021-03-14: 1000 mL via INTRAVENOUS
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
## 2021-03-14 NOTE — ED Notes (Addendum)
Patient transported to CT by TRN, NT  and Surgeon.
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
## 2021-03-14 NOTE — Procedures (Signed)
Chest tube insertion  Date/Time: 03/14/2021 4:00 AM Performed by: Axel Filler, MD Authorized by: Axel Filler, MD   Consent:    Consent obtained:  Emergent situation   Alternatives discussed:  No treatment Pre-procedure details:    Skin preparation:  ChloraPrep Anesthesia (see MAR for exact dosages):    Anesthesia method:  None Procedure details:    Placement location:  R lateral   Tube size (Fr):  20   Technique: blunt     Dissection instrument:  Finger and Kelly clamp   Tension pneumothorax: no     Tube connected to:  Water seal and suction   Drainage characteristics:  Bloody   Suture material:  2-0 silk   Dressing:  4x4 sterile gauze Post-procedure details:    Post-insertion x-ray findings: tube in good position     Patient tolerance of procedure:  Tolerated well, no immediate complications
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
## 2021-03-15 ENCOUNTER — Inpatient Hospital Stay (HOSPITAL_COMMUNITY): Payer: Self-pay

## 2021-03-15 ENCOUNTER — Other Ambulatory Visit: Payer: Self-pay

## 2021-03-15 ENCOUNTER — Encounter (HOSPITAL_COMMUNITY): Payer: Self-pay

## 2021-03-15 LAB — COMPREHENSIVE METABOLIC PANEL
ALT: 213 U/L — ABNORMAL HIGH (ref 0–44)
AST: 158 U/L — ABNORMAL HIGH (ref 15–41)
Albumin: 3.4 g/dL — ABNORMAL LOW (ref 3.5–5.0)
Alkaline Phosphatase: 44 U/L (ref 38–126)
Anion gap: 9 (ref 5–15)
BUN: 10 mg/dL (ref 6–20)
CO2: 27 mmol/L (ref 22–32)
Calcium: 8.9 mg/dL (ref 8.9–10.3)
Chloride: 100 mmol/L (ref 98–111)
Creatinine, Ser: 0.82 mg/dL (ref 0.61–1.24)
GFR, Estimated: >60 mL/min (ref >60)
Glucose, Bld: 106 mg/dL — ABNORMAL HIGH (ref 70–99)
Potassium: 3.7 mmol/L (ref 3.5–5.1)
Sodium: 136 mmol/L (ref 135–145)
Total Bilirubin: 1.5 mg/dL — ABNORMAL HIGH (ref 0.3–1.2)
Total Protein: 6.2 g/dL — ABNORMAL LOW (ref 6.5–8.1)

## 2021-03-15 LAB — HEMOGLOBIN AND HEMATOCRIT, BLOOD
HCT: 40.8 % (ref 39.0–52.0)
Hemoglobin: 14 g/dL (ref 13.0–17.0)

## 2021-03-15 LAB — CBC
HCT: 39.1 % (ref 39.0–52.0)
Hemoglobin: 13.2 g/dL (ref 13.0–17.0)
MCH: 30.8 pg (ref 26.0–34.0)
MCHC: 33.8 g/dL (ref 30.0–36.0)
MCV: 91.4 fL (ref 80.0–100.0)
Platelets: 203 10*3/uL (ref 150–400)
RBC: 4.28 MIL/uL (ref 4.22–5.81)
RDW: 12.7 % (ref 11.5–15.5)
WBC: 14.6 10*3/uL — ABNORMAL HIGH (ref 4.0–10.5)
nRBC: 0 % (ref 0.0–0.2)

## 2021-03-15 MED ORDER — HYDROMORPHONE HCL 1 MG/ML IJ SOLN
1.0000 mg | INTRAMUSCULAR | Status: DC | PRN
  Administered 2021-03-16: 1 mg via INTRAVENOUS
  Filled 2021-03-15: qty 1

## 2021-03-15 NOTE — Progress Notes (Signed)
Floor RN noted that there was no order for chest tube maintenance/suction. Dr. Fredricka Bonine notified and order obtained. Please see orders. Primary RN notified.

## 2021-03-15 NOTE — TOC CAGE-AID Note (Signed)
Transition of Care [facility]) - CAGE-AID Screening   Patient Details  Name: Ricardo Randall MRN: [id number] Date of Birth: April 24, 1996    Clinical Narrative:  Pt denies alcohol and drug use/abuse and declined an offer for any education.   CAGE-AID Screening:    Have You Ever Felt You Ought to Cut Down on Your Drinking or Drug Use?: No Have People Annoyed You By Critizing Your Drinking Or Drug Use?: No Have You Felt Bad Or Guilty About Your Drinking Or Drug Use?: No Have You Ever Had a Drink or Used Drugs First Thing In The Morning to Steady Your Nerves or to Get Rid of a Hangover?: No CAGE-AID Score: 0  Substance Abuse Education Offered: No
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
## 2021-03-16 ENCOUNTER — Encounter (HOSPITAL_COMMUNITY): Payer: Self-pay

## 2021-03-16 ENCOUNTER — Inpatient Hospital Stay (HOSPITAL_COMMUNITY): Payer: Self-pay

## 2021-03-16 LAB — COMPREHENSIVE METABOLIC PANEL
ALT: 153 U/L — ABNORMAL HIGH (ref 0–44)
AST: 70 U/L — ABNORMAL HIGH (ref 15–41)
Albumin: 3.2 g/dL — ABNORMAL LOW (ref 3.5–5.0)
Alkaline Phosphatase: 40 U/L (ref 38–126)
Anion gap: 8 (ref 5–15)
BUN: 6 mg/dL (ref 6–20)
CO2: 27 mmol/L (ref 22–32)
Calcium: 8.8 mg/dL — ABNORMAL LOW (ref 8.9–10.3)
Chloride: 98 mmol/L (ref 98–111)
Creatinine, Ser: 0.78 mg/dL (ref 0.61–1.24)
GFR, Estimated: >60 mL/min (ref >60)
Glucose, Bld: 92 mg/dL (ref 70–99)
Potassium: 3.6 mmol/L (ref 3.5–5.1)
Sodium: 133 mmol/L — ABNORMAL LOW (ref 135–145)
Total Bilirubin: 1.5 mg/dL — ABNORMAL HIGH (ref 0.3–1.2)
Total Protein: 6.2 g/dL — ABNORMAL LOW (ref 6.5–8.1)

## 2021-03-16 LAB — CBC
HCT: 34.6 % — ABNORMAL LOW (ref 39.0–52.0)
Hemoglobin: 11.8 g/dL — ABNORMAL LOW (ref 13.0–17.0)
MCH: 30.9 pg (ref 26.0–34.0)
MCHC: 34.1 g/dL (ref 30.0–36.0)
MCV: 90.6 fL (ref 80.0–100.0)
Platelets: 173 10*3/uL (ref 150–400)
RBC: 3.82 MIL/uL — ABNORMAL LOW (ref 4.22–5.81)
RDW: 12.1 % (ref 11.5–15.5)
WBC: 12.1 10*3/uL — ABNORMAL HIGH (ref 4.0–10.5)
nRBC: 0 % (ref 0.0–0.2)

## 2021-03-16 MED ORDER — OXYCODONE HCL 5 MG PO TABS
10.0000 mg | ORAL_TABLET | Freq: Once | ORAL | Status: AC
  Administered 2021-03-16: 10 mg via ORAL
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
## 2021-03-16 NOTE — Progress Notes (Signed)
OT Cancellation Note  Patient Details Name: BOLIVAR KORANDA MRN: [id number] DOB: 12/17/1995   Cancelled Treatment:    Reason Eval/Treat Not Completed: Active bedrest order (Per Dr. Bedelia Person, "OK to be seen by therapy 8/4.")   Flora Lipps, OTR/L Acute Rehabilitation Services Pager: [phone] Office: [phone]  Mehar Kirkwood C 03/16/2021, 8:09 AM

## 2021-03-16 NOTE — Progress Notes (Signed)
PT Cancellation Note  Patient Details Name: Ricardo Randall MRN: [id number] DOB: 1995-12-13   Cancelled Treatment:    Reason Eval/Treat Not Completed: Active bedrest order (PT eval order received. Per MD Lovick note, "Okay to start PT/OT 8/4." Will evaluate tomorrow.  Lillia Pauls, PT, DPT Acute Rehabilitation Services Pager [phone] Office [phone]    Norval Morton 03/16/2021, 7:59 AM

## 2021-03-17 ENCOUNTER — Inpatient Hospital Stay (HOSPITAL_COMMUNITY): Payer: Self-pay

## 2021-03-17 LAB — COMPREHENSIVE METABOLIC PANEL
ALT: 99 U/L — ABNORMAL HIGH (ref 0–44)
AST: 30 U/L (ref 15–41)
Albumin: 3.1 g/dL — ABNORMAL LOW (ref 3.5–5.0)
Alkaline Phosphatase: 38 U/L (ref 38–126)
Anion gap: 9 (ref 5–15)
BUN: 5 mg/dL — ABNORMAL LOW (ref 6–20)
CO2: 28 mmol/L (ref 22–32)
Calcium: 9 mg/dL (ref 8.9–10.3)
Chloride: 95 mmol/L — ABNORMAL LOW (ref 98–111)
Creatinine, Ser: 0.72 mg/dL (ref 0.61–1.24)
GFR, Estimated: >60 mL/min (ref >60)
Glucose, Bld: 97 mg/dL (ref 70–99)
Potassium: 3.7 mmol/L (ref 3.5–5.1)
Sodium: 132 mmol/L — ABNORMAL LOW (ref 135–145)
Total Bilirubin: 1.5 mg/dL — ABNORMAL HIGH (ref 0.3–1.2)
Total Protein: 6.5 g/dL (ref 6.5–8.1)

## 2021-03-17 LAB — CBC
HCT: 33.9 % — ABNORMAL LOW (ref 39.0–52.0)
HCT: 34 % — ABNORMAL LOW (ref 39.0–52.0)
Hemoglobin: 11.8 g/dL — ABNORMAL LOW (ref 13.0–17.0)
Hemoglobin: 12.1 g/dL — ABNORMAL LOW (ref 13.0–17.0)
MCH: 31.1 pg (ref 26.0–34.0)
MCH: 31.5 pg (ref 26.0–34.0)
MCHC: 34.8 g/dL (ref 30.0–36.0)
MCHC: 35.6 g/dL (ref 30.0–36.0)
MCV: 89.2 fL (ref 80.0–100.0)
MCV: 88.5 fL (ref 80.0–100.0)
Platelets: 183 10*3/uL (ref 150–400)
Platelets: 212 10*3/uL (ref 150–400)
RBC: 3.8 MIL/uL — ABNORMAL LOW (ref 4.22–5.81)
RBC: 3.84 MIL/uL — ABNORMAL LOW (ref 4.22–5.81)
RDW: 11.9 % (ref 11.5–15.5)
RDW: 12 % (ref 11.5–15.5)
WBC: 12.8 10*3/uL — ABNORMAL HIGH (ref 4.0–10.5)
WBC: 11.7 10*3/uL — ABNORMAL HIGH (ref 4.0–10.5)
nRBC: 0 % (ref 0.0–0.2)
nRBC: 0 % (ref 0.0–0.2)

## 2021-03-17 MED ORDER — ACETAMINOPHEN 500 MG PO TABS
1000.0000 mg | ORAL_TABLET | Freq: Four times a day (QID) | ORAL | Status: DC
  Administered 2021-03-17 – 2021-03-20 (×14): 1000 mg via ORAL
  Filled 2021-03-17 (×15): qty 2

## 2021-03-17 MED ORDER — SODIUM CHLORIDE 0.9 % IV SOLN: INTRAVENOUS | Status: DC

## 2021-03-17 MED ORDER — HYDROMORPHONE HCL 1 MG/ML IJ SOLN: 0.5000 mg | Freq: Three times a day (TID) | INTRAMUSCULAR | Status: DC | PRN

## 2021-03-17 MED ORDER — OXYCODONE HCL 5 MG PO TABS
10.0000 mg | ORAL_TABLET | ORAL | Status: DC | PRN
  Administered 2021-03-17 – 2021-03-20 (×19): 15 mg via ORAL
  Filled 2021-03-17 (×19): qty 3

## 2021-03-17 MED ORDER — HYDROMORPHONE HCL 1 MG/ML IJ SOLN: 0.2500 mg | Freq: Three times a day (TID) | INTRAMUSCULAR | Status: DC | PRN

## 2021-03-17 MED ORDER — ENOXAPARIN SODIUM 40 MG/0.4ML IJ SOSY
40.0000 mg | PREFILLED_SYRINGE | Freq: Two times a day (BID) | INTRAMUSCULAR | Status: DC
  Administered 2021-03-17 – 2021-03-20 (×7): 40 mg via SUBCUTANEOUS
  Filled 2021-03-17 (×7): qty 0.4

## 2021-03-17 MED ORDER — SODIUM CHLORIDE 1 G PO TABS
1.0000 g | ORAL_TABLET | Freq: Three times a day (TID) | ORAL | Status: DC
  Administered 2021-03-17 – 2021-03-20 (×11): 1 g via ORAL
  Filled 2021-03-17 (×10): qty 1

## 2021-03-17 NOTE — Plan of Care (Signed)
  Problem: Health Behavior/Discharge Planning: Goal: Ability to manage health-related needs will improve Outcome: Progressing   Problem: Clinical Measurements: Goal: Will remain free from infection Outcome: Progressing   
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
## 2021-03-18 ENCOUNTER — Other Ambulatory Visit (HOSPITAL_COMMUNITY): Payer: Self-pay

## 2021-03-18 ENCOUNTER — Inpatient Hospital Stay (HOSPITAL_COMMUNITY): Payer: Self-pay

## 2021-03-18 LAB — CBC
HCT: 33.5 % — ABNORMAL LOW (ref 39.0–52.0)
Hemoglobin: 11.8 g/dL — ABNORMAL LOW (ref 13.0–17.0)
MCH: 31.5 pg (ref 26.0–34.0)
MCHC: 35.2 g/dL (ref 30.0–36.0)
MCV: 89.3 fL (ref 80.0–100.0)
Platelets: 208 10*3/uL (ref 150–400)
RBC: 3.75 MIL/uL — ABNORMAL LOW (ref 4.22–5.81)
RDW: 12.2 % (ref 11.5–15.5)
WBC: 13.2 10*3/uL — ABNORMAL HIGH (ref 4.0–10.5)
nRBC: 0 % (ref 0.0–0.2)

## 2021-03-18 LAB — COMPREHENSIVE METABOLIC PANEL
ALT: 70 U/L — ABNORMAL HIGH (ref 0–44)
AST: 20 U/L (ref 15–41)
Albumin: 3 g/dL — ABNORMAL LOW (ref 3.5–5.0)
Alkaline Phosphatase: 42 U/L (ref 38–126)
Anion gap: 6 (ref 5–15)
BUN: 7 mg/dL (ref 6–20)
CO2: 29 mmol/L (ref 22–32)
Calcium: 8.6 mg/dL — ABNORMAL LOW (ref 8.9–10.3)
Chloride: 101 mmol/L (ref 98–111)
Creatinine, Ser: 0.87 mg/dL (ref 0.61–1.24)
GFR, Estimated: >60 mL/min (ref >60)
Glucose, Bld: 110 mg/dL — ABNORMAL HIGH (ref 70–99)
Potassium: 3.9 mmol/L (ref 3.5–5.1)
Sodium: 136 mmol/L (ref 135–145)
Total Bilirubin: 1 mg/dL (ref 0.3–1.2)
Total Protein: 6.2 g/dL — ABNORMAL LOW (ref 6.5–8.1)

## 2021-03-18 MED ORDER — METHOCARBAMOL 500 MG PO TABS
1000.0000 mg | ORAL_TABLET | Freq: Three times a day (TID) | ORAL | 0 refills | Status: AC | PRN
  Filled 2021-03-18: qty 60, 10d supply, fill #0

## 2021-03-18 MED ORDER — ACETAMINOPHEN 500 MG PO TABS: 1000.0000 mg | ORAL_TABLET | Freq: Four times a day (QID) | ORAL | 0 refills | Status: AC

## 2021-03-18 MED ORDER — OXYCODONE HCL 10 MG PO TABS
10.0000 mg | ORAL_TABLET | Freq: Four times a day (QID) | ORAL | 0 refills | Status: AC | PRN
  Filled 2021-03-18: qty 20, 5d supply, fill #0
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
## 2021-03-18 NOTE — Progress Notes (Signed)
Patient c/o pain at left iv site,no swelling , bleeding noted around site good blood return noted from iv, fluids infusing satisfactory. I don't need this iv fluids because am drinking fine , take the out I don't it.

## 2021-03-18 NOTE — Progress Notes (Signed)
OT Cancellation Note and Discharge  Patient Details Name: Ricardo Randall MRN: [id number] DOB: November 04, 1995   Cancelled Treatment:    Reason Eval/Treat Not Completed: Other (comment). In to see patient and he reports he is doing well, getting up to bathroom with staff and his dad with RW to go to bathroom and brush teeth. He does not feel he has any concerns about basic ADLs. We will sign off.  Ignacia Palma, OTR/L Acute Rehab Services Pager [phone] Office [phone]    Evette Georges 03/18/2021, 2:44 PM
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
## 2021-03-19 ENCOUNTER — Inpatient Hospital Stay (HOSPITAL_COMMUNITY): Payer: Self-pay

## 2021-03-19 LAB — CBC
HCT: 33.6 % — ABNORMAL LOW (ref 39.0–52.0)
Hemoglobin: 11.8 g/dL — ABNORMAL LOW (ref 13.0–17.0)
MCH: 31.1 pg (ref 26.0–34.0)
MCHC: 35.1 g/dL (ref 30.0–36.0)
MCV: 88.7 fL (ref 80.0–100.0)
Platelets: 242 10*3/uL (ref 150–400)
RBC: 3.79 MIL/uL — ABNORMAL LOW (ref 4.22–5.81)
RDW: 12.2 % (ref 11.5–15.5)
WBC: 13.3 10*3/uL — ABNORMAL HIGH (ref 4.0–10.5)
nRBC: 0 % (ref 0.0–0.2)

## 2021-03-19 LAB — COMPREHENSIVE METABOLIC PANEL
ALT: 54 U/L — ABNORMAL HIGH (ref 0–44)
AST: 17 U/L (ref 15–41)
Albumin: 3.1 g/dL — ABNORMAL LOW (ref 3.5–5.0)
Alkaline Phosphatase: 39 U/L (ref 38–126)
Anion gap: 10 (ref 5–15)
BUN: 8 mg/dL (ref 6–20)
CO2: 24 mmol/L (ref 22–32)
Calcium: 8.6 mg/dL — ABNORMAL LOW (ref 8.9–10.3)
Chloride: 101 mmol/L (ref 98–111)
Creatinine, Ser: 0.69 mg/dL (ref 0.61–1.24)
GFR, Estimated: >60 mL/min (ref >60)
Glucose, Bld: 101 mg/dL — ABNORMAL HIGH (ref 70–99)
Potassium: 3.7 mmol/L (ref 3.5–5.1)
Sodium: 135 mmol/L (ref 135–145)
Total Bilirubin: 1 mg/dL (ref 0.3–1.2)
Total Protein: 6.4 g/dL — ABNORMAL LOW (ref 6.5–8.1)

## 2021-03-19 NOTE — Progress Notes (Signed)
Per nightshift RN, Selinda Michaels, chest tube had 0 mL output overnight and was not marked this am for that reason.   Robina Ade, RN

## 2021-03-19 NOTE — Progress Notes (Signed)
Chest tube removed at this time by this RN without incidence.   Robina Ade, RN
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
## 2021-03-20 ENCOUNTER — Inpatient Hospital Stay (HOSPITAL_COMMUNITY): Payer: Self-pay

## 2021-03-20 MED ORDER — DOCUSATE SODIUM 100 MG PO CAPS: 100.0000 mg | ORAL_CAPSULE | Freq: Two times a day (BID) | ORAL | 2 refills | Status: AC

## 2021-03-20 MED ORDER — IOHEXOL 350 MG/ML SOLN
100.0000 mL | Freq: Once | INTRAVENOUS | Status: AC | PRN
  Administered 2021-03-20: 100 mL via INTRAVENOUS
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
## 2021-03-20 NOTE — ED Notes (Addendum)
Pt being prepared for discharge, family at bedside. Rolling walker at bedside for home use. Pt provided medications for home use, Oxycodone x 20, Robaxin x 60. Pt verbalized understanding of medications. No further questions.  Pt is alert, pleasant. NAD

## 2021-03-21 ENCOUNTER — Encounter (HOSPITAL_COMMUNITY): Payer: Self-pay | Admitting: Student
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
## 2021-08-02 ENCOUNTER — Emergency Department (HOSPITAL_COMMUNITY)
Admission: EM | Admit: 2021-08-02 | Discharge: 2021-08-02 | Disposition: A | Discharge status: Home / Self Care | Payer: Medicaid Other | Attending: Student | Admitting: Student

## 2021-08-02 DIAGNOSIS — K047 Periapical abscess without sinus: Secondary | ICD-10-CM | POA: Insufficient documentation

## 2021-08-02 MED ORDER — PENICILLIN V POTASSIUM 500 MG PO TABS: 500.0000 mg | ORAL_TABLET | Freq: Three times a day (TID) | ORAL | 0 refills | Status: AC
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
## 2021-08-02 NOTE — Discharge Instructions (Addendum)
Please read and follow all provided instructions.  Your diagnoses today include:  1. Dental abscess     The exam and treatment you received today has been provided on an emergency basis only. This is not a substitute for complete medical or dental care.  Tests performed today include: Vital signs. See below for your results today.   Medications prescribed:  Penicillin - antibiotic  You have been prescribed an antibiotic medicine: take the entire course of medicine even if you are feeling better. Stopping early can cause the antibiotic not to work.  Take any prescribed medications only as directed.  Home care instructions:  Follow any educational materials contained in this packet.  Follow-up instructions: Please follow-up with your dentist for further evaluation of your symptoms.   Dental Assistance: See attached dental referral and/or resource guide.   Return instructions:  Please return to the Emergency Department if you experience worsening symptoms. Please return if you develop a fever, you develop more swelling in your face or neck, you have trouble breathing or swallowing food. Please return if you have any other emergent concerns.  Additional Information:  Your vital signs today were: BP 124/90 (BP Location: Right Arm)    Pulse 88    Temp 98.4 F (36.9 C) (Oral)    Resp 14    SpO2 95%  If your blood pressure (BP) was elevated above 135/85 this visit, please have this repeated by your doctor within one month. --------------

## 2021-08-02 NOTE — ED Triage Notes (Signed)
Pt c/o abscess to R lower jaw, seen when brushing teeth this AM. Gum area red & swollen around a cracked/broken tooth. Pt eating pistachios in triage, no difficulty swallowing noted.
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
## 2023-04-24 ENCOUNTER — Other Ambulatory Visit: Payer: Self-pay

## 2023-04-24 ENCOUNTER — Emergency Department (HOSPITAL_COMMUNITY)
Admission: EM | Admit: 2023-04-24 | Discharge: 2023-04-25 | Discharge status: Against Medical Advice | Payer: Medicaid Other | Attending: Emergency Medicine | Admitting: Emergency Medicine

## 2023-04-24 DIAGNOSIS — N4889 Other specified disorders of penis: Secondary | ICD-10-CM | POA: Insufficient documentation

## 2023-04-24 DIAGNOSIS — L299 Pruritus, unspecified: Secondary | ICD-10-CM | POA: Insufficient documentation

## 2023-04-24 DIAGNOSIS — Z5321 Procedure and treatment not carried out due to patient leaving prior to being seen by health care provider: Secondary | ICD-10-CM | POA: Insufficient documentation

## 2023-04-24 LAB — URINALYSIS, ROUTINE W REFLEX MICROSCOPIC
Bilirubin Urine: NEGATIVE
Glucose, UA: NEGATIVE mg/dL
Hgb urine dipstick: NEGATIVE
Ketones, ur: NEGATIVE mg/dL
Leukocytes,Ua: NEGATIVE
Nitrite: NEGATIVE
Protein, ur: NEGATIVE mg/dL
Specific Gravity, Urine: 1.015 (ref 1.005–1.030)
pH: 7.5 (ref 5.0–8.0)

## 2023-04-24 LAB — CBG MONITORING, ED: Glucose-Capillary: 85 mg/dL (ref 70–99)

## 2023-04-24 NOTE — ED Triage Notes (Signed)
Patient thinks he has a yeast infection-has itchy, painful white patches around the head of his penis x several months.  Denies penile discharge or dysuria.

## 2023-04-24 NOTE — ED Provider Triage Note (Signed)
Emergency Medicine Provider Triage Evaluation Note  [place] , a 27 y.o. male  was evaluated in triage.  Pt complains of white plaques and yeastlike discharge to the skin on his penis. Has been going on for months. No history of diabetes, immunodeficiency. No fever, abdominal pain, testicular pain, or swelling. Has had new sexual partners.  Review of Systems  Positive: See HPI Negative: See HPI  Physical Exam  BP 130/87   Pulse (!) 58   Temp 98.4 F (36.9 C) (Oral)   Resp 18   SpO2 100%  Gen:   Awake, no distress   Resp:  Normal effort  MSK:   Moves extremities without difficulty  Other:  Abdomen soft and nontender Patient requested male provider for GU exam  Medical Decision Making  Medically screening exam initiated at 5:04 PM.  Appropriate orders placed.  D[place] was informed that the remainder of the evaluation will be completed by another provider, this initial triage assessment does not replace that evaluation, and the importance of remaining in the ED until their evaluation is complete.     Tonette Lederer, PA-C 04/24/23 1706

## 2023-04-24 NOTE — ED Notes (Signed)
Pt stated he wasn't waiting anymore to take him off the call list. Pt seen laving the ED.

## 2023-04-25 ENCOUNTER — Encounter (HOSPITAL_COMMUNITY): Payer: Self-pay

## 2023-04-25 ENCOUNTER — Emergency Department (HOSPITAL_COMMUNITY)
Admission: EM | Admit: 2023-04-25 | Discharge: 2023-04-25 | Disposition: A | Discharge status: Home / Self Care | Payer: Medicaid Other | Attending: Emergency Medicine | Admitting: Emergency Medicine

## 2023-04-25 DIAGNOSIS — B3742 Candidal balanitis: Secondary | ICD-10-CM | POA: Insufficient documentation

## 2023-04-25 MED ORDER — CLOTRIMAZOLE 1 % EX CREA: TOPICAL_CREAM | CUTANEOUS | 0 refills | Status: AC

## 2023-04-25 NOTE — ED Provider Notes (Signed)
[place] EMERGENCY DEPARTMENT AT [facility] Provider Note   CSN: [id number] Arrival date & time: 04/25/23  1327     History  Chief Complaint  Patient presents with   Penile problem    Ricardo Randall is a 27 y.o. male otherwise healthy presents today for evaluation of penile pain.  Patient reports itching, white patches around the head of his penis for the past few months.  He denies any urinary symptoms or discharge.  He denies any fever.  Denies any lumps/bumps around the genital area.  HPI  History reviewed. No pertinent past medical history. Past Surgical History:  Procedure Laterality Date   TONSILLECTOMY       Home Medications Prior to Admission medications   Medication Sig Start Date End Date Taking? Authorizing Provider  clotrimazole (LOTRIMIN) 1 % cream Apply to affected area 2 times daily 04/25/23  Yes Jeanelle Malling, PA  acetaminophen (TYLENOL) 500 MG tablet Take 2 tablets (1,000 mg total) by mouth every 6 (six) hours. 03/18/21   Maczis, Elmer Sow, PA-C  methocarbamol (ROBAXIN) 500 MG tablet Take 2 tablets (1,000 mg total) by mouth every 8 (eight) hours as needed for muscle spasms. 03/18/21   Maczis, Elmer Sow, PA-C  Oxycodone HCl 10 MG TABS Take 1 tablet (10 mg total) by mouth every 6 (six) hours as needed for breakthrough pain. 03/18/21   Maczis, Elmer Sow, PA-C  penicillin v potassium (VEETID) 500 MG tablet Take 1 tablet (500 mg total) by mouth 3 (three) times daily. 08/02/21   Renne Crigler, PA-C      Allergies    Patient has no known allergies.    Review of Systems   Review of Systems Negative except as per HPI.  Physical Exam Updated Vital Signs BP 116/73 (BP Location: Right Arm)   Pulse 67   Temp 98.7 F (37.1 C) (Oral)   Resp 18   Ht 5\' 9"  (1.753 m)   Wt 113.4 kg   SpO2 100%   BMI 36.92 kg/m  Physical Exam Vitals and nursing note reviewed.  Constitutional:      Appearance: Normal appearance.  HENT:     Head: Normocephalic and atraumatic.      Mouth/Throat:     Mouth: Mucous membranes are moist.  Eyes:     General: No scleral icterus. Cardiovascular:     Rate and Rhythm: Normal rate and regular rhythm.     Pulses: Normal pulses.     Heart sounds: Normal heart sounds.  Pulmonary:     Effort: Pulmonary effort is normal.     Breath sounds: Normal breath sounds.  Abdominal:     General: Abdomen is flat.     Palpations: Abdomen is soft.     Tenderness: There is no abdominal tenderness.  Genitourinary:    Comments: GU exam with RN present throughout examination.  Uncircumcised penis with white patches at the tip of the penis.  No vesicles or rash noted. Musculoskeletal:        General: No deformity.  Skin:    General: Skin is warm.     Findings: No rash.  Neurological:     General: No focal deficit present.     Mental Status: He is alert.  Psychiatric:        Mood and Affect: Mood normal.     ED Results / Procedures / Treatments   Labs (all labs ordered are listed, but only abnormal results are displayed) Labs Reviewed - No data to display  EKG None  Radiology No results found.  Procedures Procedures    Medications Ordered in ED Medications - No data to display  ED Course/ Medical Decision Making/ A&P                                 Medical Decision Making  This patient presents to the ED for penile pain, this involves an extensive number of treatment options, and is a complaint that carries with a high risk of complications and morbidity.  The differential diagnosis includes balanitis, STI, UTI, herpes.  This is not an exhaustive list.  Problem list/ ED course/ Critical interventions/ Medical management: HPI: See above Vital signs within normal range and stable throughout visit. Laboratory/imaging studies significant for: See above. On physical examination, patient is afebrile and appears in no acute distress.  Patient presenting with a chief complaints of itching and white patches around head of  penis for the past few months.  He denies any urinary symptoms.  He denies any abnormal penile discharge.  Denies any lumps or bumps around his genital area.  I do not see any vesicles concerning for genital herpes.  Low suspicion for UTI given lack of urinary symptoms.  Symptoms most consistent with candidal balanitis.  Will prescribe Lotrimin cream.  Advised patient to follow-up with PCP for reevaluation and management.  Strict ED return precaution discussed. I have reviewed the patient home medicines and have made adjustments as needed.  Cardiac monitoring/EKG: The patient was maintained on a cardiac monitor.  I personally reviewed and interpreted the cardiac monitor which showed an underlying rhythm of: sinus rhythm.  Additional history obtained: External records from outside source obtained and reviewed including: Chart review including previous notes, labs, imaging.  Consultations obtained:  Disposition Continued outpatient therapy. Follow-up with PCP recommended for reevaluation of symptoms. Treatment plan discussed with patient.  Pt acknowledged understanding was agreeable to the plan. Worrisome signs and symptoms were discussed with patient, and patient acknowledged understanding to return to the ED if they noticed these signs and symptoms. Patient was stable upon discharge.   This chart was dictated using voice recognition software.  Despite best efforts to proofread,  errors can occur which can change the documentation meaning.          Final Clinical Impression(s) / ED Diagnoses Final diagnoses:  Candidal balanitis    Rx / DC Orders ED Discharge Orders          Ordered    clotrimazole (LOTRIMIN) 1 % cream        04/25/23 1720              Jeanelle Malling, PA 04/25/23 1728    Gerhard Munch, MD 04/25/23 2321

## 2023-04-25 NOTE — ED Triage Notes (Signed)
Pt arrives via POV. Pt reports itchy, white patches around the head of his penis for the past few months. Denies urinary symptoms or discharge.

## 2023-04-25 NOTE — Discharge Instructions (Addendum)
Please use Lomitrin cream as prescribed. Keep your genital area dry and clean. Use condoms during sexual intercourse. Take tylenol/ibuprofen for pain. I recommend close follow-up with PCP for reevaluation.  Please do not hesitate to return to emergency department if worrisome signs symptoms we discussed become apparent.

## 2023-04-26 LAB — GC/CHLAMYDIA PROBE AMP (~~LOC~~) NOT AT ARMC
Chlamydia: NEGATIVE
Comment: NEGATIVE
Comment: NORMAL
Neisseria Gonorrhea: NEGATIVE
